# Patient Record
Sex: Female | Born: 1990
Health system: Southern US, Community
[De-identification: ages and names within clinical notes are randomized; demographics above are authoritative.]

## PROBLEM LIST (undated history)

## (undated) DIAGNOSIS — E119 Type 2 diabetes mellitus without complications: Secondary | ICD-10-CM

## (undated) DIAGNOSIS — J1282 Pneumonia due to coronavirus disease 2019: Secondary | ICD-10-CM

## (undated) DIAGNOSIS — U071 COVID-19: Secondary | ICD-10-CM

---

## 2013-12-28 ENCOUNTER — Emergency Department (HOSPITAL_COMMUNITY)
Admission: EM | Admit: 2013-12-28 | Discharge: 2013-12-29 | Disposition: A | Discharge status: Home / Self Care | Payer: No Typology Code available for payment source | Attending: Emergency Medicine | Admitting: Emergency Medicine

## 2013-12-28 ENCOUNTER — Encounter (HOSPITAL_COMMUNITY): Payer: Self-pay | Admitting: Emergency Medicine

## 2013-12-28 DIAGNOSIS — R0602 Shortness of breath: Secondary | ICD-10-CM | POA: Insufficient documentation

## 2013-12-28 DIAGNOSIS — IMO0002 Reserved for concepts with insufficient information to code with codable children: Secondary | ICD-10-CM | POA: Insufficient documentation

## 2013-12-28 DIAGNOSIS — S99929A Unspecified injury of unspecified foot, initial encounter: Secondary | ICD-10-CM

## 2013-12-28 DIAGNOSIS — M79661 Pain in right lower leg: Secondary | ICD-10-CM

## 2013-12-28 DIAGNOSIS — S8990XA Unspecified injury of unspecified lower leg, initial encounter: Secondary | ICD-10-CM | POA: Insufficient documentation

## 2013-12-28 DIAGNOSIS — S335XXA Sprain of ligaments of lumbar spine, initial encounter: Secondary | ICD-10-CM | POA: Insufficient documentation

## 2013-12-28 DIAGNOSIS — S39012A Strain of muscle, fascia and tendon of lower back, initial encounter: Secondary | ICD-10-CM

## 2013-12-28 DIAGNOSIS — Y92410 Unspecified street and highway as the place of occurrence of the external cause: Secondary | ICD-10-CM | POA: Insufficient documentation

## 2013-12-28 DIAGNOSIS — Y9389 Activity, other specified: Secondary | ICD-10-CM | POA: Insufficient documentation

## 2013-12-28 DIAGNOSIS — S99919A Unspecified injury of unspecified ankle, initial encounter: Secondary | ICD-10-CM
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
## 2013-12-29 ENCOUNTER — Ambulatory Visit (HOSPITAL_COMMUNITY)
Admission: RE | Admit: 2013-12-29 | Discharge: 2013-12-29 | Disposition: A | Discharge status: Home / Self Care | Payer: Self-pay | Source: Ambulatory Visit | Attending: Emergency Medicine | Admitting: Emergency Medicine

## 2013-12-29 DIAGNOSIS — M7989 Other specified soft tissue disorders: Secondary | ICD-10-CM

## 2013-12-29 DIAGNOSIS — M79609 Pain in unspecified limb: Secondary | ICD-10-CM | POA: Insufficient documentation

## 2013-12-29 MED ORDER — ENOXAPARIN SODIUM 120 MG/0.8ML ~~LOC~~ SOLN
1.0000 mg/kg | Freq: Once | SUBCUTANEOUS | Status: AC
Start: 2013-12-29 — End: 2013-12-29
  Administered 2013-12-29: 115 mg via SUBCUTANEOUS
  Filled 2013-12-29: qty 0.8

## 2013-12-29 MED ORDER — HYDROCODONE-ACETAMINOPHEN 5-325 MG PO TABS: 1.0000 | ORAL_TABLET | Freq: Four times a day (QID) | ORAL | Status: DC | PRN

## 2013-12-29 MED ORDER — NAPROXEN 500 MG PO TABS: 500.0000 mg | ORAL_TABLET | Freq: Two times a day (BID) | ORAL | Status: DC

## 2013-12-29 NOTE — ED Provider Notes (Signed)
Medical screening examination/treatment/procedure(s) were performed by non-physician practitioner and as supervising physician I was immediately available for consultation/collaboration.   EKG Interpretation None        William Avrianna Smart, MD 12/29/13 2315 
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
## 2013-12-29 NOTE — Progress Notes (Signed)
Right lower extremity venous duplex completed.  Right:  No evidence of DVT, superficial thrombosis, or Baker's cyst.  Left:  Negative for DVT in the common femoral vein.  

## 2014-04-14 ENCOUNTER — Emergency Department (HOSPITAL_COMMUNITY)
Admission: EM | Admit: 2014-04-14 | Discharge: 2014-04-14 | Discharge status: Against Medical Advice | Payer: No Typology Code available for payment source | Attending: Emergency Medicine | Admitting: Emergency Medicine

## 2014-04-14 ENCOUNTER — Encounter (HOSPITAL_COMMUNITY): Payer: Self-pay | Admitting: Emergency Medicine

## 2014-04-14 DIAGNOSIS — R51 Headache: Secondary | ICD-10-CM | POA: Insufficient documentation

## 2014-04-14 LAB — URINALYSIS, ROUTINE W REFLEX MICROSCOPIC
BILIRUBIN URINE: NEGATIVE
Glucose, UA: NEGATIVE mg/dL
Ketones, ur: NEGATIVE mg/dL
Nitrite: NEGATIVE
PH: 6 (ref 5.0–8.0)
Protein, ur: NEGATIVE mg/dL
SPECIFIC GRAVITY, URINE: 1.031 — AB (ref 1.005–1.030)
UROBILINOGEN UA: 1 mg/dL (ref 0.0–1.0)

## 2014-04-14 LAB — URINE MICROSCOPIC-ADD ON

## 2014-04-14 LAB — POC URINE PREG, ED: PREG TEST UR: NEGATIVE

## 2014-04-14 NOTE — ED Notes (Signed)
Pt c/o headache x's 1 1/2 weeks, nausea without vomiting.  Pt also st's she also has had dizziness with headache.  Pt also c/o sinus drainage

## 2014-04-14 NOTE — ED Notes (Signed)
Called x 1 no answer

## 2014-07-29 ENCOUNTER — Emergency Department (HOSPITAL_COMMUNITY)
Admission: EM | Admit: 2014-07-29 | Discharge: 2014-07-30 | Disposition: A | Discharge status: Home / Self Care | Payer: Self-pay | Attending: Emergency Medicine | Admitting: Emergency Medicine

## 2014-07-29 ENCOUNTER — Encounter (HOSPITAL_COMMUNITY): Payer: Self-pay | Admitting: *Deleted

## 2014-07-29 DIAGNOSIS — R1012 Left upper quadrant pain: Secondary | ICD-10-CM

## 2014-07-29 DIAGNOSIS — Z3202 Encounter for pregnancy test, result negative: Secondary | ICD-10-CM | POA: Insufficient documentation

## 2014-07-29 DIAGNOSIS — N939 Abnormal uterine and vaginal bleeding, unspecified: Secondary | ICD-10-CM

## 2014-07-29 DIAGNOSIS — Z791 Long term (current) use of non-steroidal anti-inflammatories (NSAID): Secondary | ICD-10-CM | POA: Insufficient documentation

## 2014-07-29 DIAGNOSIS — N938 Other specified abnormal uterine and vaginal bleeding: Secondary | ICD-10-CM | POA: Insufficient documentation

## 2014-07-29 LAB — URINE MICROSCOPIC-ADD ON

## 2014-07-29 LAB — CBC WITH DIFFERENTIAL/PLATELET
BASOS ABS: 0 10*3/uL (ref 0.0–0.1)
Basophils Relative: 0 % (ref 0–1)
Eosinophils Absolute: 0.1 10*3/uL (ref 0.0–0.7)
Eosinophils Relative: 2 % (ref 0–5)
HCT: 39.1 % (ref 36.0–46.0)
Hemoglobin: 12.9 g/dL (ref 12.0–15.0)
LYMPHS PCT: 45 % (ref 12–46)
Lymphs Abs: 3.4 10*3/uL (ref 0.7–4.0)
MCH: 27.3 pg (ref 26.0–34.0)
MCHC: 33 g/dL (ref 30.0–36.0)
MCV: 82.8 fL (ref 78.0–100.0)
Monocytes Absolute: 0.5 10*3/uL (ref 0.1–1.0)
Monocytes Relative: 7 % (ref 3–12)
NEUTROS ABS: 3.5 10*3/uL (ref 1.7–7.7)
Neutrophils Relative %: 46 % (ref 43–77)
PLATELETS: 365 10*3/uL (ref 150–400)
RBC: 4.72 MIL/uL (ref 3.87–5.11)
RDW: 13.1 % (ref 11.5–15.5)
WBC: 7.6 10*3/uL (ref 4.0–10.5)

## 2014-07-29 LAB — URINALYSIS, ROUTINE W REFLEX MICROSCOPIC
BILIRUBIN URINE: NEGATIVE
Glucose, UA: NEGATIVE mg/dL
KETONES UR: NEGATIVE mg/dL
Nitrite: NEGATIVE
Protein, ur: NEGATIVE mg/dL
Specific Gravity, Urine: 1.026 (ref 1.005–1.030)
UROBILINOGEN UA: 0.2 mg/dL (ref 0.0–1.0)
pH: 5 (ref 5.0–8.0)

## 2014-07-29 LAB — PREGNANCY, URINE: Preg Test, Ur: NEGATIVE

## 2014-07-29 LAB — SAMPLE TO BLOOD BANK

## 2014-07-29 NOTE — ED Notes (Signed)
Pt states her OBGYN told her she was pregnant on Monday. Pt c/o abdominal cramping and some spotting when she wiped, and nausea. Denies vomiting Pt is a G1P0. LMP over two months ago.

## 2014-07-30 LAB — COMPREHENSIVE METABOLIC PANEL
ALBUMIN: 3.7 g/dL (ref 3.5–5.2)
ALT: 25 U/L (ref 0–35)
ANION GAP: 9 (ref 5–15)
AST: 25 U/L (ref 0–37)
Alkaline Phosphatase: 61 U/L (ref 39–117)
BILIRUBIN TOTAL: 0.2 mg/dL — AB (ref 0.3–1.2)
BUN: 8 mg/dL (ref 6–23)
CALCIUM: 9.3 mg/dL (ref 8.4–10.5)
CHLORIDE: 109 mmol/L (ref 96–112)
CO2: 21 mmol/L (ref 19–32)
CREATININE: 0.93 mg/dL (ref 0.50–1.10)
GFR calc non Af Amer: 87 mL/min — ABNORMAL LOW (ref >90)
Glucose, Bld: 102 mg/dL — ABNORMAL HIGH (ref 70–99)
Potassium: 3.7 mmol/L (ref 3.5–5.1)
Sodium: 139 mmol/L (ref 135–145)
Total Protein: 7.4 g/dL (ref 6.0–8.3)

## 2014-07-30 LAB — HCG, QUANTITATIVE, PREGNANCY: hCG, Beta Chain, Quant, S: <1 m[IU]/mL (ref <5)

## 2014-07-30 MED ORDER — ONDANSETRON 8 MG PO TBDP: 8.0000 mg | ORAL_TABLET | Freq: Three times a day (TID) | ORAL | Status: DC | PRN

## 2014-07-30 NOTE — ED Notes (Signed)
Patient states she was told she is pregnant at OB/GYN and is spotting this morning. N&V present. Was concerned about the spotting

## 2014-07-30 NOTE — ED Notes (Signed)
MD at bedside. Patient provided with results of lab work.

## 2014-07-30 NOTE — Discharge Instructions (Signed)

## 2014-07-30 NOTE — ED Provider Notes (Signed)
CSN: [id number]     Arrival date & time 07/29/14  2201 History  This chart was scribed for Lyanne Co, MD by Tanda Rockers, ED Scribe. This patient was seen in room B19C/B19C and the patient's care was started at 12:30 AM.    Chief Complaint  Patient presents with  . Possible Pregnancy  . Abdominal Pain   The history is provided by the patient. No language interpreter was used.     HPI Comments: Rachel Russo is a 24 y.o. female who presents to the Emergency Department complaining of upper abdominal pain that began earlier today. She also complains of nausea and vaginal spotting only upon wiping. Pt was seen at [facility] on Monday, 2/22, and had urine pregnancy test done which resulted positive. Pt states that she is no longer nauseas at this time. She denies vomiting, bowel changes, or any other symptoms. LNMP: November 2015. Pt denies being on any birth control at this time.   History reviewed. No pertinent past medical history. History reviewed. No pertinent past surgical history. History reviewed. No pertinent family history. History  Substance Use Topics  . Smoking status: Never Smoker   . Smokeless tobacco: Not on file  . Alcohol Use: No   OB History    Gravida Para Term Preterm AB TAB SAB Ectopic Multiple Living   1              Review of Systems  A complete 10 system review of systems was obtained and all systems are negative except as noted in the HPI and PMH.    Allergies  Review of patient's allergies indicates no known allergies.  Home Medications   Prior to Admission medications   Medication Sig Start Date End Date Taking? Authorizing Provider  Aspirin-Salicylamide-Caffeine (BC HEADACHE POWDER PO) Take 1 tablet by mouth daily as needed. For headache    Historical Provider, MD  HYDROcodone-acetaminophen (NORCO) 5-325 MG per tablet Take 1-2 tablets by mouth every 6 (six) hours as needed for moderate pain. Patient not taking: Reported on 04/14/2014 12/29/13    Arthor Captain, PA-C  naproxen (NAPROSYN) 500 MG tablet Take 1 tablet (500 mg total) by mouth 2 (two) times daily. Patient not taking: Reported on 04/14/2014 12/29/13   Arthor Captain, PA-C   Triage Vitals: BP 157/100 mmHg  Pulse 112  Temp(Src) 98.4 F (36.9 C) (Oral)  Resp 18  Ht  (1.753 m)  Wt 277 lb 8 oz (125.873 kg)  BMI 40.96 kg/m2  SpO2 98%  LMP 12/30/2013  Physical Exam  Constitutional: She is oriented to person, place, and time. She appears well-developed and well-nourished. No distress.  HENT:  Head: Normocephalic and atraumatic.  Eyes: EOM are normal.  Neck: Normal range of motion.  Cardiovascular: Normal rate, regular rhythm and normal heart sounds.   Pulmonary/Chest: Effort normal and breath sounds normal.  Abdominal: Soft. She exhibits no distension. There is no tenderness.  Musculoskeletal: Normal range of motion.  Neurological: She is alert and oriented to person, place, and time.  Skin: Skin is warm and dry.  Psychiatric: She has a normal mood and affect. Judgment normal.  Nursing note and vitals reviewed.   ED Course  Procedures (including critical care time) DIAGNOSTIC STUDIES: Oxygen Saturation is 98% on RA, normal by my interpretation.    COORDINATION OF CARE:   12:33 AM-Discussed treatment plan which includes follow up with OBGYN with pt at bedside and pt agreed to plan.   Labs Review Labs Reviewed  COMPREHENSIVE  METABOLIC PANEL - Abnormal; Notable for the following:    Glucose, Bld 102 (*)    Total Bilirubin 0.2 (*)    GFR calc non Af Amer 87 (*)    All other components within normal limits  URINALYSIS, ROUTINE W REFLEX MICROSCOPIC - Abnormal; Notable for the following:    APPearance CLOUDY (*)    Hgb urine dipstick TRACE (*)    Leukocytes, UA SMALL (*)    All other components within normal limits  URINE MICROSCOPIC-ADD ON - Abnormal; Notable for the following:    Squamous Epithelial / LPF FEW (*)    All other components within normal  limits  CBC WITH DIFFERENTIAL/PLATELET  PREGNANCY, URINE  HCG, QUANTITATIVE, PREGNANCY  SAMPLE TO BLOOD BANK    Imaging Review No results found.   EKG Interpretation None      MDM   Final diagnoses:  None    Patient is overall well-appearing.  Abdominal exam is benign.  Vital signs are normal. Pregnancy test is negative.  Primary care follow-up   I personally performed the services described in this documentation, which was scribed in my presence. The recorded information has been reviewed and is accurate.       Lyanne CoKevin M Branndon Tuite, MD 07/30/14 [phone]

## 2014-10-22 ENCOUNTER — Encounter (HOSPITAL_BASED_OUTPATIENT_CLINIC_OR_DEPARTMENT_OTHER): Payer: Self-pay | Admitting: *Deleted

## 2014-10-22 ENCOUNTER — Emergency Department (HOSPITAL_BASED_OUTPATIENT_CLINIC_OR_DEPARTMENT_OTHER)
Admission: EM | Admit: 2014-10-22 | Discharge: 2014-10-22 | Disposition: A | Discharge status: Home / Self Care | Payer: Self-pay | Attending: Emergency Medicine | Admitting: Emergency Medicine

## 2014-10-22 ENCOUNTER — Emergency Department (HOSPITAL_BASED_OUTPATIENT_CLINIC_OR_DEPARTMENT_OTHER): Payer: Self-pay

## 2014-10-22 DIAGNOSIS — Z79899 Other long term (current) drug therapy: Secondary | ICD-10-CM | POA: Insufficient documentation

## 2014-10-22 DIAGNOSIS — R42 Dizziness and giddiness: Secondary | ICD-10-CM | POA: Insufficient documentation

## 2014-10-22 DIAGNOSIS — Z3202 Encounter for pregnancy test, result negative: Secondary | ICD-10-CM | POA: Insufficient documentation

## 2014-10-22 DIAGNOSIS — N390 Urinary tract infection, site not specified: Secondary | ICD-10-CM | POA: Insufficient documentation

## 2014-10-22 DIAGNOSIS — Z791 Long term (current) use of non-steroidal anti-inflammatories (NSAID): Secondary | ICD-10-CM | POA: Insufficient documentation

## 2014-10-22 LAB — CBC WITH DIFFERENTIAL/PLATELET
BASOS ABS: 0 10*3/uL (ref 0.0–0.1)
Basophils Relative: 0 % (ref 0–1)
EOS PCT: 1 % (ref 0–5)
Eosinophils Absolute: 0.1 10*3/uL (ref 0.0–0.7)
HCT: 40.8 % (ref 36.0–46.0)
Hemoglobin: 13.6 g/dL (ref 12.0–15.0)
LYMPHS PCT: 41 % (ref 12–46)
Lymphs Abs: 2.1 10*3/uL (ref 0.7–4.0)
MCH: 27.4 pg (ref 26.0–34.0)
MCHC: 33.3 g/dL (ref 30.0–36.0)
MCV: 82.3 fL (ref 78.0–100.0)
Monocytes Absolute: 0.5 10*3/uL (ref 0.1–1.0)
Monocytes Relative: 9 % (ref 3–12)
NEUTROS ABS: 2.5 10*3/uL (ref 1.7–7.7)
NEUTROS PCT: 49 % (ref 43–77)
PLATELETS: 349 10*3/uL (ref 150–400)
RBC: 4.96 MIL/uL (ref 3.87–5.11)
RDW: 13 % (ref 11.5–15.5)
WBC: 5.2 10*3/uL (ref 4.0–10.5)

## 2014-10-22 LAB — URINALYSIS, ROUTINE W REFLEX MICROSCOPIC
BILIRUBIN URINE: NEGATIVE
GLUCOSE, UA: NEGATIVE mg/dL
Hgb urine dipstick: NEGATIVE
Ketones, ur: NEGATIVE mg/dL
Nitrite: NEGATIVE
PROTEIN: NEGATIVE mg/dL
Specific Gravity, Urine: 1.023 (ref 1.005–1.030)
UROBILINOGEN UA: 0.2 mg/dL (ref 0.0–1.0)
pH: 6 (ref 5.0–8.0)

## 2014-10-22 LAB — BASIC METABOLIC PANEL
Anion gap: 8 (ref 5–15)
BUN: 10 mg/dL (ref 6–20)
CALCIUM: 9.5 mg/dL (ref 8.9–10.3)
CO2: 26 mmol/L (ref 22–32)
CREATININE: 0.97 mg/dL (ref 0.44–1.00)
Chloride: 105 mmol/L (ref 101–111)
Glucose, Bld: 95 mg/dL (ref 65–99)
Potassium: 3.7 mmol/L (ref 3.5–5.1)
Sodium: 139 mmol/L (ref 135–145)

## 2014-10-22 LAB — URINE MICROSCOPIC-ADD ON

## 2014-10-22 LAB — PREGNANCY, URINE: PREG TEST UR: NEGATIVE

## 2014-10-22 MED ORDER — SODIUM CHLORIDE 0.9 % IV BOLUS (SEPSIS): 1000.0000 mL | Freq: Once | INTRAVENOUS | Status: DC

## 2014-10-22 MED ORDER — CEPHALEXIN 500 MG PO CAPS: 500.0000 mg | ORAL_CAPSULE | Freq: Two times a day (BID) | ORAL | Status: DC

## 2014-10-22 NOTE — ED Notes (Signed)
Pt declined IVF; states she has to leave.

## 2014-10-22 NOTE — ED Notes (Signed)
For over a week she has been lightheaded and dizzy.

## 2014-10-22 NOTE — Discharge Instructions (Signed)
Follow up with your doctor for continued or worsening symptoms Dizziness  Dizziness means you feel unsteady or lightheaded. You might feel like you are going to pass out (faint). HOME CARE   Drink enough fluids to keep your pee (urine) clear or pale yellow.  Take your medicines exactly as told by your doctor. If you take blood pressure medicine, always stand up slowly from the lying or sitting position. Hold on to something to steady yourself.  If you need to stand in one place for a long time, move your legs often. Tighten and relax your leg muscles.  Have someone stay with you until you feel okay.  Do not drive or use heavy machinery if you feel dizzy.  Do not drink alcohol. GET HELP RIGHT AWAY IF:   You feel dizzy or lightheaded and it gets worse.  You feel sick to your stomach (nauseous), or you throw up (vomit).  You have trouble talking or walking.  You feel weak or have trouble using your arms, hands, or legs.  You cannot think clearly or have trouble forming sentences.  You have chest pain, belly (abdominal) pain, sweating, or you are short of breath.  Your vision changes.  You are bleeding.  You have problems from your medicine that seem to be getting worse. MAKE SURE YOU:   Understand these instructions.  Will watch your condition.  Will get help right away if you are not doing well or get worse. Document Released: 05/11/2011 Document Revised: 08/14/2011 Document Reviewed: 05/11/2011 [facility] Patient Information 2015 [place], [place]. This information is not intended to replace advice given to you by your health care provider. Make sure you discuss any questions you have with your health care provider.  Urinary Tract Infection A urinary tract infection (UTI) can occur any place along the urinary tract. The tract includes the kidneys, ureters, bladder, and urethra. A type of germ called bacteria often causes a UTI. UTIs are often helped with antibiotic medicine.    HOME CARE   If given, take antibiotics as told by your doctor. Finish them even if you start to feel better.  Drink enough fluids to keep your pee (urine) clear or pale yellow.  Avoid tea, drinks with caffeine, and bubbly (carbonated) drinks.  Pee often. Avoid holding your pee in for a long time.  Pee before and after having sex (intercourse).  Wipe from front to back after you poop (bowel movement) if you are a woman. Use each tissue only once. GET HELP RIGHT AWAY IF:   You have back pain.  You have lower belly (abdominal) pain.  You have chills.  You feel sick to your stomach (nauseous).  You throw up (vomit).  Your burning or discomfort with peeing does not go away.  You have a fever.  Your symptoms are not better in 3 days. MAKE SURE YOU:   Understand these instructions.  Will watch your condition.  Will get help right away if you are not doing well or get worse. Document Released: 11/08/2007 Document Revised: 02/14/2012 Document Reviewed: 12/21/2011 [facility] Patient Information 2015 [place], [place]. This information is not intended to replace advice given to you by your health care provider. Make sure you discuss any questions you have with your health care provider.

## 2014-10-22 NOTE — ED Provider Notes (Signed)
CSN: [id number]     Arrival date & time 10/22/14  1139 History   First MD Initiated Contact with Patient 10/22/14 1157     Chief Complaint  Patient presents with  . Dizziness     (Consider location/radiation/quality/duration/timing/severity/associated sxs/prior Treatment) HPI Comments: Pt states that she has a family history of aneurysm and she wants to make sure that she doesn't have one.  Patient is a 24 y.o. female presenting with dizziness. The history is provided by the patient. No language interpreter was used.  Dizziness Quality:  Lightheadedness Severity:  Moderate Onset quality:  Gradual Timing:  Constant Progression:  Unchanged Chronicity:  New Context: head movement   Relieved by:  Nothing Worsened by:  Movement Ineffective treatments:  None tried Associated symptoms: no chest pain, no diarrhea, no headaches, no nausea, no palpitations, no shortness of breath, no vision changes, no vomiting and no weakness     History reviewed. No pertinent past medical history. History reviewed. No pertinent past surgical history. No family history on file. History  Substance Use Topics  . Smoking status: Never Smoker   . Smokeless tobacco: Not on file  . Alcohol Use: No   OB History    Gravida Para Term Preterm AB TAB SAB Ectopic Multiple Living   1              Review of Systems  Respiratory: Negative for shortness of breath.   Cardiovascular: Negative for chest pain and palpitations.  Gastrointestinal: Negative for nausea, vomiting and diarrhea.  Neurological: Positive for dizziness. Negative for weakness and headaches.  All other systems reviewed and are negative.     Allergies  Review of patient's allergies indicates no known allergies.  Home Medications   Prior to Admission medications   Medication Sig Start Date End Date Taking? Authorizing Provider  Aspirin-Salicylamide-Caffeine (BC HEADACHE POWDER PO) Take 1 tablet by mouth daily as needed. For headache     Historical Provider, MD  HYDROcodone-acetaminophen (NORCO) 5-325 MG per tablet Take 1-2 tablets by mouth every 6 (six) hours as needed for moderate pain. Patient not taking: Reported on 04/14/2014 12/29/13   Arthor CaptainAbigail Harris, PA-C  naproxen (NAPROSYN) 500 MG tablet Take 1 tablet (500 mg total) by mouth 2 (two) times daily. Patient not taking: Reported on 04/14/2014 12/29/13   Arthor CaptainAbigail Harris, PA-C  ondansetron (ZOFRAN ODT) 8 MG disintegrating tablet Take 1 tablet (8 mg total) by mouth every 8 (eight) hours as needed for nausea or vomiting. 07/30/14   Azalia BilisKevin Campos, MD   BP 147/78 mmHg  Pulse 82  Temp(Src) 98 F (36.7 C) (Oral)  Resp 16  Ht 5\' 10"  (1.778 m)  Wt 280 lb (127.007 kg)  BMI 40.18 kg/m2  SpO2 100%  LMP 07/24/2014  Breastfeeding? Unknown Physical Exam  Constitutional: She is oriented to person, place, and time. She appears well-developed and well-nourished.  HENT:  Head: Normocephalic and atraumatic.  Right Ear: External ear normal.  Left Ear: External ear normal.  Mouth/Throat: Oropharynx is clear and moist.  Eyes: Conjunctivae and EOM are normal. Pupils are equal, round, and reactive to light.  Neck: Normal range of motion. Neck supple.  Cardiovascular: Normal rate and regular rhythm.   Pulmonary/Chest: Effort normal and breath sounds normal.  Abdominal: Soft. Bowel sounds are normal. There is no tenderness.  Musculoskeletal: Normal range of motion.  Neurological: She is alert and oriented to person, place, and time. She exhibits normal muscle tone. Coordination normal.  Normal finger to nose and no pronator  drift. Pt is ambulatory with assistance  Skin: Skin is warm and dry.  Psychiatric: She has a normal mood and affect.  Nursing note and vitals reviewed.   ED Course  Procedures (including critical care time) Labs Review Labs Reviewed  URINALYSIS, ROUTINE W REFLEX MICROSCOPIC - Abnormal; Notable for the following:    APPearance CLOUDY (*)    Leukocytes, UA LARGE (*)     All other components within normal limits  URINE MICROSCOPIC-ADD ON - Abnormal; Notable for the following:    Squamous Epithelial / LPF FEW (*)    All other components within normal limits  PREGNANCY, URINE  BASIC METABOLIC PANEL  CBC WITH DIFFERENTIAL/PLATELET    Imaging Review Ct Head Wo Contrast  10/22/2014   CLINICAL DATA:  Dizziness for 1 week.  EXAM: CT HEAD WITHOUT CONTRAST  TECHNIQUE: Contiguous axial images were obtained from the base of the skull through the vertex without intravenous contrast.  COMPARISON:  None.  FINDINGS: There is no evidence of mass effect, midline shift or extra-axial fluid collections. There is no evidence of a space-occupying lesion or intracranial hemorrhage. There is no evidence of a cortical-based area of acute infarction.  The ventricles and sulci are appropriate for the patient's age. The basal cisterns are patent.  Visualized portions of the orbits are unremarkable. The visualized portions of the paranasal sinuses and mastoid air cells are unremarkable.  The osseous structures are unremarkable.  IMPRESSION: Normal CT of the brain without intravenous contrast.   Electronically Signed   By: Elige KoHetal  Patel   On: 10/22/2014 12:54     EKG Interpretation None      MDM   Final diagnoses:  Dizziness  UTI (lower urinary tract infection)    Pt is refusing fluids.ambulatory without problems. Pt is ready to go. Will treat for uti. Discussed return precautions    Teressa LowerVrinda Dionna Wiedemann, NP 10/22/14 1328  Elwin MochaBlair Walden, MD 10/22/14 [phone]

## 2015-01-28 ENCOUNTER — Encounter (HOSPITAL_BASED_OUTPATIENT_CLINIC_OR_DEPARTMENT_OTHER): Payer: Self-pay | Admitting: *Deleted

## 2015-01-28 ENCOUNTER — Emergency Department (HOSPITAL_BASED_OUTPATIENT_CLINIC_OR_DEPARTMENT_OTHER): Payer: Self-pay

## 2015-01-28 ENCOUNTER — Emergency Department (HOSPITAL_BASED_OUTPATIENT_CLINIC_OR_DEPARTMENT_OTHER)
Admission: EM | Admit: 2015-01-28 | Discharge: 2015-01-28 | Disposition: A | Discharge status: Home / Self Care | Payer: Self-pay | Attending: Emergency Medicine | Admitting: Emergency Medicine

## 2015-01-28 DIAGNOSIS — J069 Acute upper respiratory infection, unspecified: Secondary | ICD-10-CM | POA: Insufficient documentation

## 2015-01-28 DIAGNOSIS — R1031 Right lower quadrant pain: Secondary | ICD-10-CM

## 2015-01-28 DIAGNOSIS — Z3202 Encounter for pregnancy test, result negative: Secondary | ICD-10-CM | POA: Insufficient documentation

## 2015-01-28 LAB — COMPREHENSIVE METABOLIC PANEL
ALBUMIN: 3.9 g/dL (ref 3.5–5.0)
ALT: 21 U/L (ref 14–54)
AST: 19 U/L (ref 15–41)
Alkaline Phosphatase: 51 U/L (ref 38–126)
Anion gap: 11 (ref 5–15)
BUN: 7 mg/dL (ref 6–20)
CHLORIDE: 103 mmol/L (ref 101–111)
CO2: 25 mmol/L (ref 22–32)
Calcium: 9.4 mg/dL (ref 8.9–10.3)
Creatinine, Ser: 1.07 mg/dL — ABNORMAL HIGH (ref 0.44–1.00)
GFR calc Af Amer: >60 mL/min (ref >60)
GLUCOSE: 118 mg/dL — AB (ref 65–99)
POTASSIUM: 3.6 mmol/L (ref 3.5–5.1)
Sodium: 139 mmol/L (ref 135–145)
Total Bilirubin: 0.6 mg/dL (ref 0.3–1.2)
Total Protein: 7.4 g/dL (ref 6.5–8.1)

## 2015-01-28 LAB — URINE MICROSCOPIC-ADD ON

## 2015-01-28 LAB — CBC WITH DIFFERENTIAL/PLATELET
Basophils Absolute: 0 10*3/uL (ref 0.0–0.1)
Basophils Relative: 0 % (ref 0–1)
Eosinophils Absolute: 0 10*3/uL (ref 0.0–0.7)
Eosinophils Relative: 0 % (ref 0–5)
HEMATOCRIT: 37.8 % (ref 36.0–46.0)
Hemoglobin: 12.7 g/dL (ref 12.0–15.0)
Lymphocytes Relative: 14 % (ref 12–46)
Lymphs Abs: 1.2 10*3/uL (ref 0.7–4.0)
MCH: 27.6 pg (ref 26.0–34.0)
MCHC: 33.6 g/dL (ref 30.0–36.0)
MCV: 82.2 fL (ref 78.0–100.0)
Monocytes Absolute: 0.7 10*3/uL (ref 0.1–1.0)
Monocytes Relative: 8 % (ref 3–12)
NEUTROS ABS: 6.9 10*3/uL (ref 1.7–7.7)
Neutrophils Relative %: 78 % — ABNORMAL HIGH (ref 43–77)
PLATELETS: 346 10*3/uL (ref 150–400)
RBC: 4.6 MIL/uL (ref 3.87–5.11)
RDW: 13.2 % (ref 11.5–15.5)
WBC: 8.8 10*3/uL (ref 4.0–10.5)

## 2015-01-28 LAB — URINALYSIS, ROUTINE W REFLEX MICROSCOPIC
Bilirubin Urine: NEGATIVE
Glucose, UA: NEGATIVE mg/dL
Ketones, ur: NEGATIVE mg/dL
LEUKOCYTES UA: NEGATIVE
Nitrite: NEGATIVE
PROTEIN: NEGATIVE mg/dL
Specific Gravity, Urine: 1.016 (ref 1.005–1.030)
UROBILINOGEN UA: 0.2 mg/dL (ref 0.0–1.0)
pH: 8.5 — ABNORMAL HIGH (ref 5.0–8.0)

## 2015-01-28 LAB — PREGNANCY, URINE: PREG TEST UR: NEGATIVE

## 2015-01-28 MED ORDER — ONDANSETRON HCL 4 MG PO TABS: 4.0000 mg | ORAL_TABLET | Freq: Four times a day (QID) | ORAL | Status: DC

## 2015-01-28 MED ORDER — ONDANSETRON HCL 4 MG/2ML IJ SOLN
4.0000 mg | Freq: Once | INTRAMUSCULAR | Status: AC
  Administered 2015-01-28: 4 mg via INTRAVENOUS
  Filled 2015-01-28: qty 2

## 2015-01-28 MED ORDER — SODIUM CHLORIDE 0.9 % IV BOLUS (SEPSIS)
1000.0000 mL | Freq: Once | INTRAVENOUS | Status: AC
  Administered 2015-01-28: 1000 mL via INTRAVENOUS

## 2015-01-28 NOTE — ED Provider Notes (Signed)
CSN: [id number]     Arrival date & time 01/28/15  1515 History   First MD Initiated Contact with Patient 01/28/15 1522     Chief Complaint  Patient presents with  . Nausea     The history is provided by the patient. No language interpreter was used.   Rachel Russo sense for evaluation of nausea. She states that since last night she had nasal congestion, sneezing, sore throat, nausea, diffuse abdominal pain. Symptoms are moderate and constant in nature. She had dry heaves since this morning. She has no reported fevers at home but she has not taken her temperature. She has a mild cough. She denies any vaginal discharge, constipation, diarrhea. She has no known sick contacts. She also reports having her menses since July 2. She states that the bleeding has been constant and like a regular period to slightly heavier than a period. Symptoms are moderate, constant, worsening.  History reviewed. No pertinent past medical history. History reviewed. No pertinent past surgical history. No family history on file. Social History  Substance Use Topics  . Smoking status: Never Smoker   . Smokeless tobacco: None  . Alcohol Use: No   OB History    Gravida Para Term Preterm AB TAB SAB Ectopic Multiple Living   1              Review of Systems  All other systems reviewed and are negative.     Allergies  Review of patient's allergies indicates no known allergies.  Home Medications   Prior to Admission medications   Medication Sig Start Date End Date Taking? Authorizing Provider  Aspirin-Salicylamide-Caffeine (BC HEADACHE POWDER PO) Take 1 tablet by mouth daily as needed. For headache    Historical Provider, MD  cephALEXin (KEFLEX) 500 MG capsule Take 1 capsule (500 mg total) by mouth 2 (two) times daily. 10/22/14   Teressa Lower, NP  HYDROcodone-acetaminophen (NORCO) 5-325 MG per tablet Take 1-2 tablets by mouth every 6 (six) hours as needed for moderate pain. Patient not taking: Reported on  04/14/2014 12/29/13   Arthor Captain, PA-C  naproxen (NAPROSYN) 500 MG tablet Take 1 tablet (500 mg total) by mouth 2 (two) times daily. Patient not taking: Reported on 04/14/2014 12/29/13   Arthor Captain, PA-C  ondansetron (ZOFRAN ODT) 8 MG disintegrating tablet Take 1 tablet (8 mg total) by mouth every 8 (eight) hours as needed for nausea or vomiting. 07/30/14   Azalia Bilis, MD   BP 134/70 mmHg  Pulse 110  Temp(Src) 100.4 F (38 C) (Oral)  Resp 18  Ht  (1.778 m)  Wt 280 lb (127.007 kg)  BMI 40.18 kg/m2  SpO2 99%  LMP 12/05/2014 Physical Exam  Constitutional: She is oriented to person, place, and time. She appears well-developed and well-nourished.  HENT:  Head: Normocephalic and atraumatic.  Mouth/Throat: Oropharynx is clear and moist.  TMs without erythema or effusions bilaterally  Neck: Neck supple.  Cardiovascular: Normal rate and regular rhythm.   No murmur heard. Pulmonary/Chest: Effort normal and breath sounds normal. No respiratory distress.  Abdominal: Soft. There is no rebound and no guarding.  Moderate right-sided abdominal tenderness greatest over the right lower quadrant without any guarding or rebound  Musculoskeletal: She exhibits no edema or tenderness.  Neurological: She is alert and oriented to person, place, and time.  Skin: Skin is warm and dry.  Psychiatric: She has a normal mood and affect. Her behavior is normal.  Nursing note and vitals reviewed.   ED  Course  Procedures (including critical care time) Labs Review Labs Reviewed  URINALYSIS, ROUTINE W REFLEX MICROSCOPIC (NOT AT [facility]) - Abnormal; Notable for the following:    APPearance CLOUDY (*)    pH 8.5 (*)    Hgb urine dipstick LARGE (*)    All other components within normal limits  COMPREHENSIVE METABOLIC PANEL - Abnormal; Notable for the following:    Glucose, Bld 118 (*)    Creatinine, Ser 1.07 (*)    All other components within normal limits  CBC WITH DIFFERENTIAL/PLATELET - Abnormal;  Notable for the following:    Neutrophils Relative % 78 (*)    All other components within normal limits  URINE MICROSCOPIC-ADD ON - Abnormal; Notable for the following:    Squamous Epithelial / LPF FEW (*)    Bacteria, UA FEW (*)    All other components within normal limits  PREGNANCY, URINE    Imaging Review No results found.   EKG Interpretation None      MDM   Final diagnoses:  URI, acute  Right lower quadrant abdominal pain    Patient here for violation of nausea, lower abdominal pain, congestion. In terms of congestion, this is consistent with URI. Patient does have right lower quadrant tenderness on examination, she is feeling improved after Zofran but does have continued tenderness. Recommend imaging to further evaluate and patient declines at this time. Plan to DC home with close return precautions for repeat abdominal examination Hours.    Tilden Fossa, MD 01/28/15 [phone]

## 2015-01-28 NOTE — ED Notes (Signed)
MD at bedside. 

## 2015-01-28 NOTE — Discharge Instructions (Signed)
Abdominal Pain, Women °Abdominal (stomach, pelvic, or belly) pain can be caused by many things. It is important to tell your doctor: °· The location of the pain. °· Does it come and go or is it present all the time? °· Are there things that start the pain (eating certain foods, exercise)? °· Are there other symptoms associated with the pain (fever, nausea, vomiting, diarrhea)? °All of this is helpful to know when trying to find the cause of the pain. °CAUSES  °· Stomach: virus or bacteria infection, or ulcer. °· Intestine: appendicitis (inflamed appendix), regional ileitis (Crohn's disease), ulcerative colitis (inflamed colon), irritable bowel syndrome, diverticulitis (inflamed diverticulum of the colon), or cancer of the stomach or intestine. °· Gallbladder disease or stones in the gallbladder. °· Kidney disease, kidney stones, or infection. °· Pancreas infection or cancer. °· Fibromyalgia (pain disorder). °· Diseases of the female organs: °· Uterus: fibroid (non-cancerous) tumors or infection. °· Fallopian tubes: infection or tubal pregnancy. °· Ovary: cysts or tumors. °· Pelvic adhesions (scar tissue). °· Endometriosis (uterus lining tissue growing in the pelvis and on the pelvic organs). °· Pelvic congestion syndrome (female organs filling up with blood just before the menstrual period). °· Pain with the menstrual period. °· Pain with ovulation (producing an egg). °· Pain with an IUD (intrauterine device, birth control) in the uterus. °· Cancer of the female organs. °· Functional pain (pain not caused by a disease, may improve without treatment). °· Psychological pain. °· Depression. °DIAGNOSIS  °Your doctor will decide the seriousness of your pain by doing an examination. °· Blood tests. °· X-rays. °· Ultrasound. °· CT scan (computed tomography, special type of X-ray). °· MRI (magnetic resonance imaging). °· Cultures, for infection. °· Barium enema (dye inserted in the large intestine, to better view it with  X-rays). °· Colonoscopy (looking in intestine with a lighted tube). °· Laparoscopy (minor surgery, looking in abdomen with a lighted tube). °· Major abdominal exploratory surgery (looking in abdomen with a large incision). °TREATMENT  °The treatment will depend on the cause of the pain.  °· Many cases can be observed and treated at home. °· Over-the-counter medicines recommended by your caregiver. °· Prescription medicine. °· Antibiotics, for infection. °· Birth control pills, for painful periods or for ovulation pain. °· Hormone treatment, for endometriosis. °· Nerve blocking injections. °· Physical therapy. °· Antidepressants. °· Counseling with a psychologist or psychiatrist. °· Minor or major surgery. °HOME CARE INSTRUCTIONS  °· Do not take laxatives, unless directed by your caregiver. °· Take over-the-counter pain medicine only if ordered by your caregiver. Do not take aspirin because it can cause an upset stomach or bleeding. °· Try a clear liquid diet (broth or water) as ordered by your caregiver. Slowly move to a bland diet, as tolerated, if the pain is related to the stomach or intestine. °· Have a thermometer and take your temperature several times a day, and record it. °· Bed rest and sleep, if it helps the pain. °· Avoid sexual intercourse, if it causes pain. °· Avoid stressful situations. °· Keep your follow-up appointments and tests, as your caregiver orders. °· If the pain does not go away with medicine or surgery, you may try: °· Acupuncture. °· Relaxation exercises (yoga, meditation). °· Group therapy. °· Counseling. °SEEK MEDICAL CARE IF:  °· You notice certain foods cause stomach pain. °· Your home care treatment is not helping your pain. °· You need stronger pain medicine. °· You want your IUD removed. °· You feel faint or   lightheaded.  You develop nausea and vomiting.  You develop a rash.  You are having side effects or an allergy to your medicine. SEEK IMMEDIATE MEDICAL CARE IF:   Your  pain does not go away or gets worse.  You have a fever.  Your pain is felt only in portions of the abdomen. The right side could possibly be appendicitis. The left lower portion of the abdomen could be colitis or diverticulitis.  You are passing blood in your stools (bright red or black tarry stools, with or without vomiting).  You have blood in your urine.  You develop chills, with or without a fever.  You pass out. MAKE SURE YOU:   Understand these instructions.  Will watch your condition.  Will get help right away if you are not doing well or get worse. Document Released: 03/19/2007 Document Revised: 10/06/2013 Document Reviewed: 04/08/2009 [facility] Patient Information 2015 [place], [place]. This information is not intended to replace advice given to you by your health care provider. Make sure you discuss any questions you have with your health care provider.  Viral Infections A viral infection can be caused by different types of viruses.Most viral infections are not serious and resolve on their own. However, some infections may cause severe symptoms and may lead to further complications. SYMPTOMS Viruses can frequently cause:  Minor sore throat.  Aches and pains.  Headaches.  Runny nose.  Different types of rashes.  Watery eyes.  Tiredness.  Cough.  Loss of appetite.  Gastrointestinal infections, resulting in nausea, vomiting, and diarrhea. These symptoms do not respond to antibiotics because the infection is not caused by bacteria. However, you might catch a bacterial infection following the viral infection. This is sometimes called a "superinfection." Symptoms of such a bacterial infection may include:  Worsening sore throat with pus and difficulty swallowing.  Swollen neck glands.  Chills and a high or persistent fever.  Severe headache.  Tenderness over the sinuses.  Persistent overall ill feeling (malaise), muscle aches, and tiredness  (fatigue).  Persistent cough.  Yellow, green, or brown mucus production with coughing. HOME CARE INSTRUCTIONS   Only take over-the-counter or prescription medicines for pain, discomfort, diarrhea, or fever as directed by your caregiver.  Drink enough water and fluids to keep your urine clear or pale yellow. Sports drinks can provide valuable electrolytes, sugars, and hydration.  Get plenty of rest and maintain proper nutrition. Soups and broths with crackers or rice are fine. SEEK IMMEDIATE MEDICAL CARE IF:   You have severe headaches, shortness of breath, chest pain, neck pain, or an unusual rash.  You have uncontrolled vomiting, diarrhea, or you are unable to keep down fluids.  You or your child has an oral temperature above 102 F (38.9 C), not controlled by medicine.  Your baby is older than 3 months with a rectal temperature of 102 F (38.9 C) or higher.  Your baby is 48 months old or younger with a rectal temperature of 100.4 F (38 C) or higher. MAKE SURE YOU:   Understand these instructions.  Will watch your condition.  Will get help right away if you are not doing well or get worse. Document Released: 03/01/2005 Document Revised: 08/14/2011 Document Reviewed: 09/26/2010 [facility] Patient Information 2015 [place], [place]. This information is not intended to replace advice given to you by your health care provider. Make sure you discuss any questions you have with your health care provider.

## 2015-01-28 NOTE — ED Notes (Addendum)
Nausea since yesterday. Abdominal pain. Vaginal bleeding for over a month.

## 2015-01-29 NOTE — ED Notes (Signed)
Patient called to state that she was feeling better and did not fell that she needs to come back in per her discharge instructions from yesterday.  Encouraged to follow her discharge instructions.  Reviewed signs and symptoms that would require her immediate return, fever, increased and localized pain, vomiting and other concerns.  Pt verbalized understanding.

## 2015-08-16 IMAGING — CT CT HEAD W/O CM
1 series · 16 of 30 positions shown, 20 images · non-contrast
Comparison: None.

CLINICAL DATA: Dizziness for 1 week.

EXAM:
CT HEAD WITHOUT CONTRAST
TECHNIQUE: Contiguous axial images were obtained from the base of the skull
through the vertex without intravenous contrast.

[Series 2: head 4.8 h37s · axial · 0.45mm/px · z∈[+1395,+1531]mm · 16 of 32 slices shown, 20 images]
[im 2/32  brain]
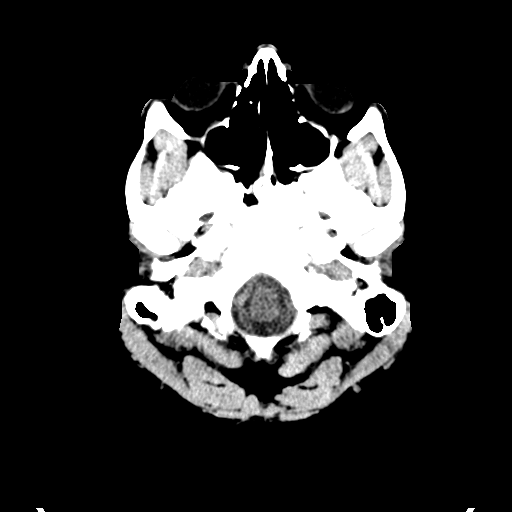
[im 2/32  bone]
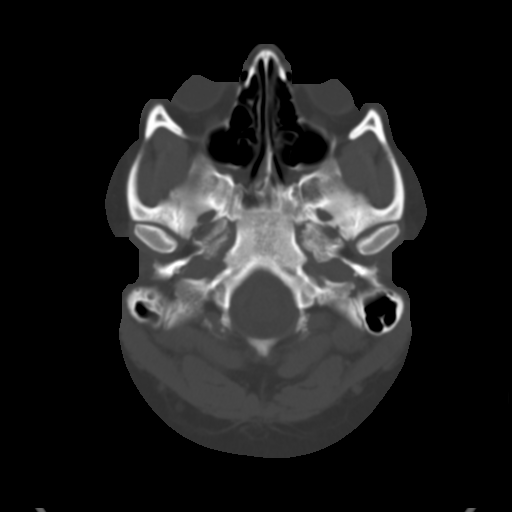
[im 4/32  brain]
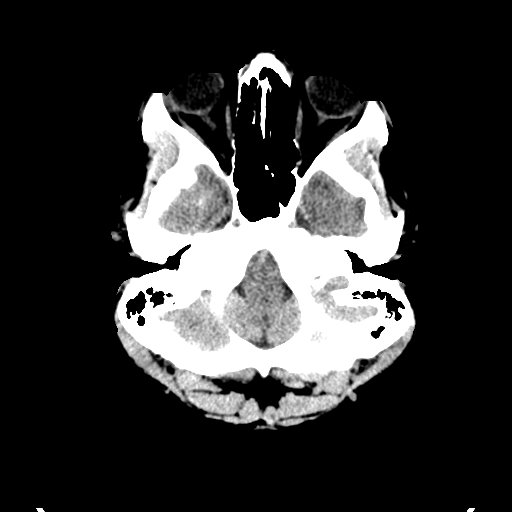
[im 6/32  brain]
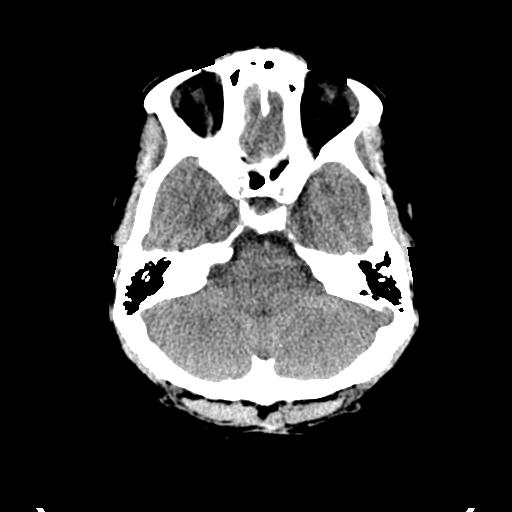
[im 8/32  brain]
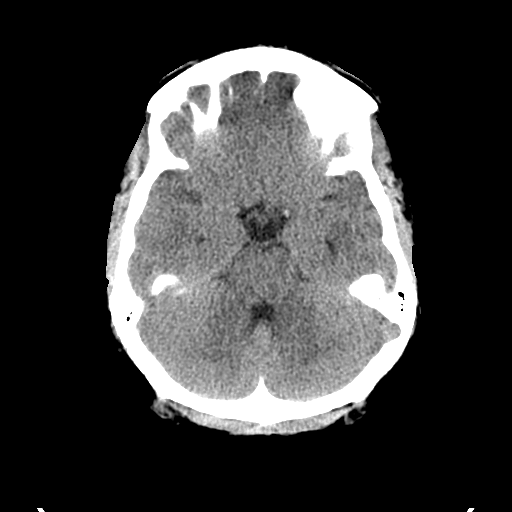
[im 9/32  brain]
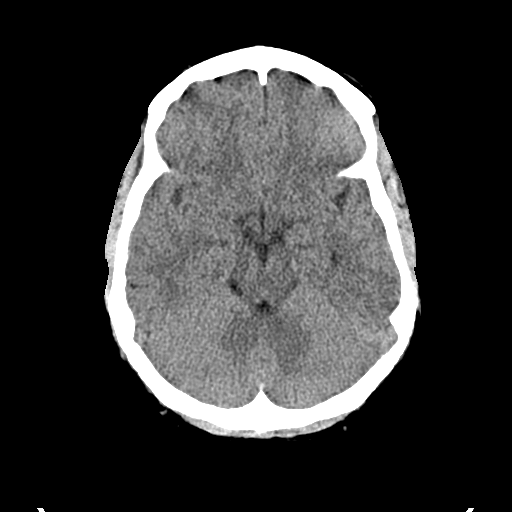
[im 9/32  bone]
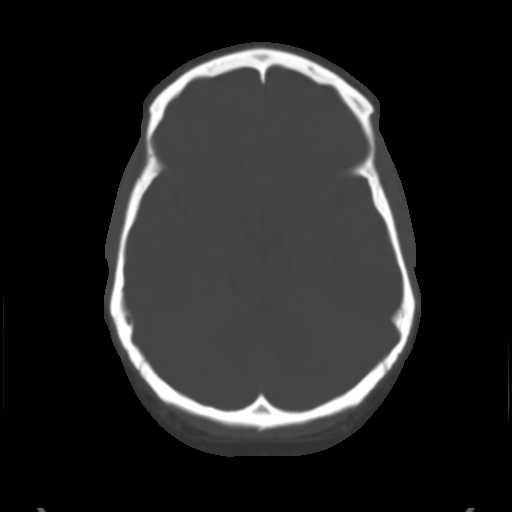
[im 11/32  brain]
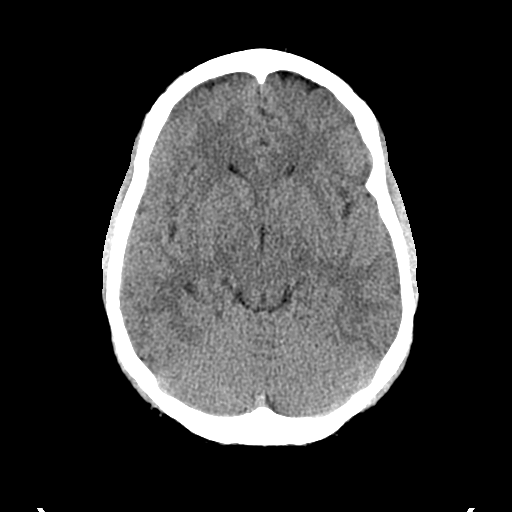
[im 13/32  brain]
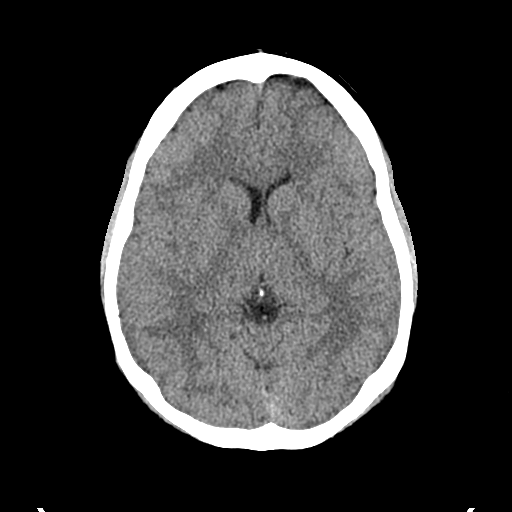
[im 15/32  brain]
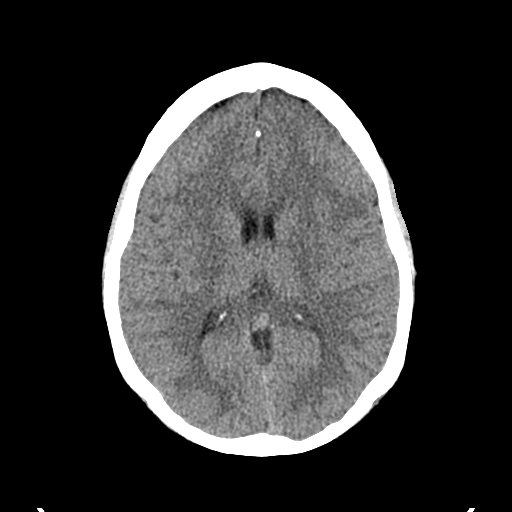
[im 17/32  brain]
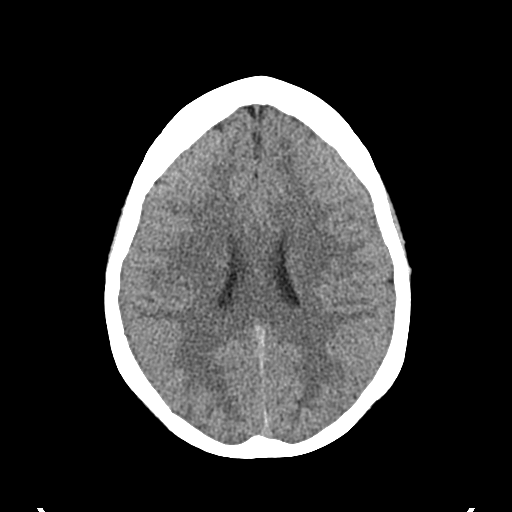
[im 17/32  bone]
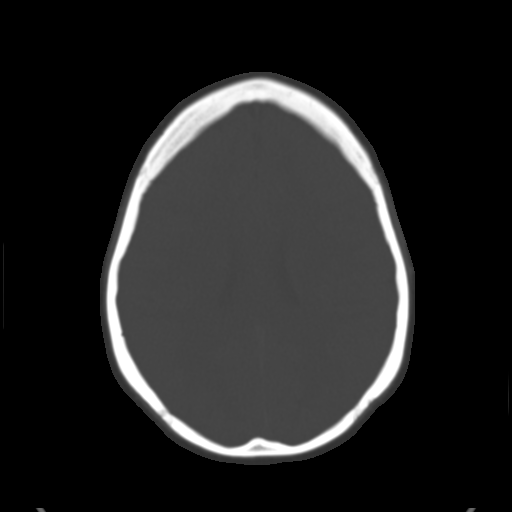
[im 19/32  brain]
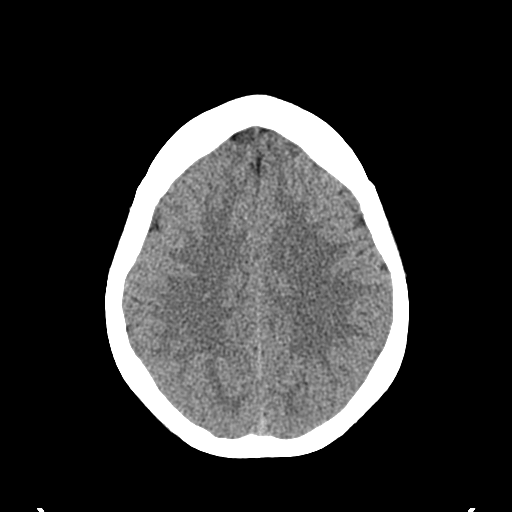
[im 21/32  brain]
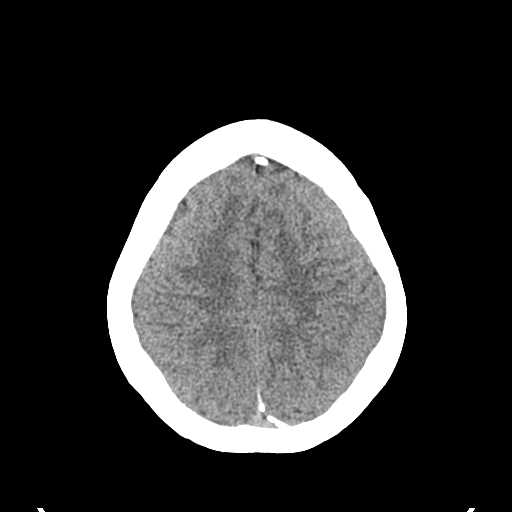
[im 23/32  brain]
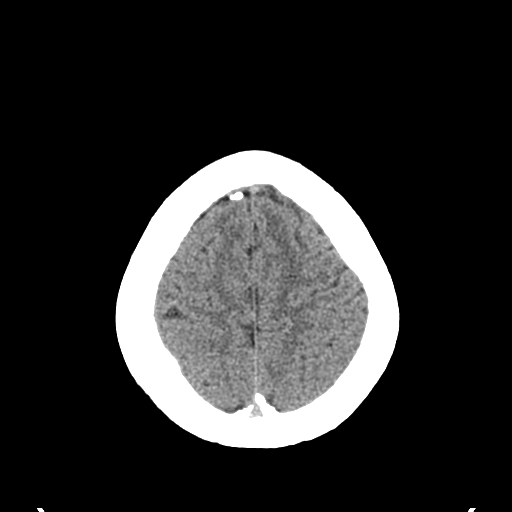
[im 24/32  brain]
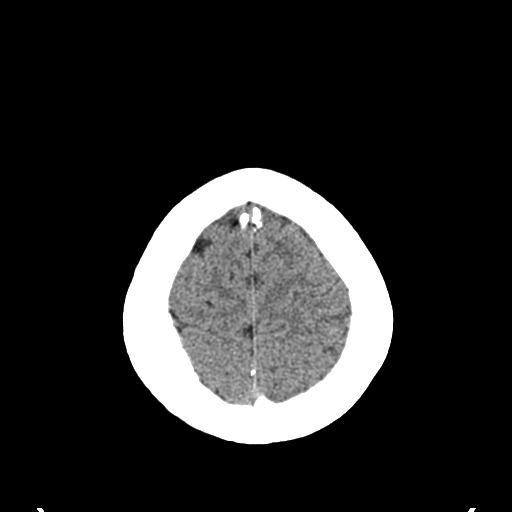
[im 24/32  bone]
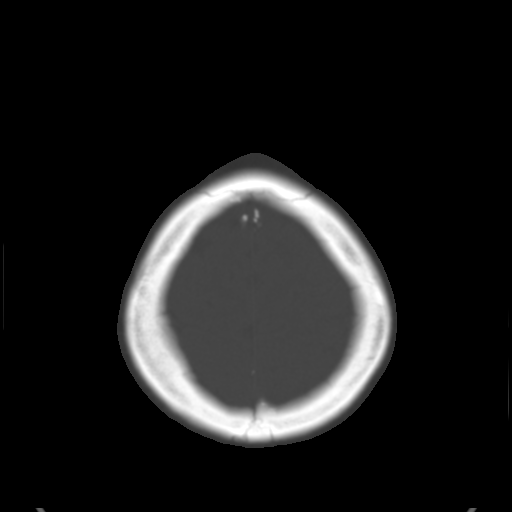
[im 26/32  brain]
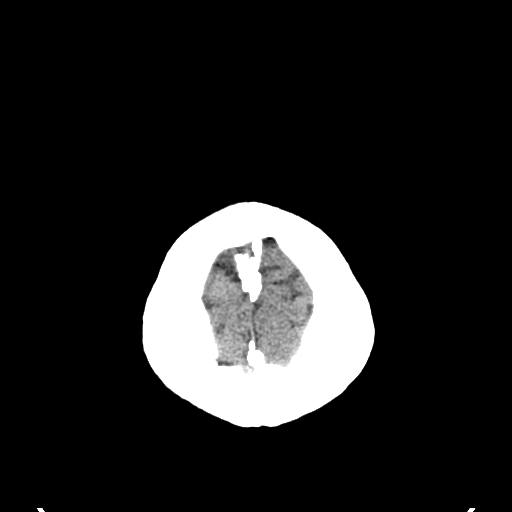
[im 28/32  brain]
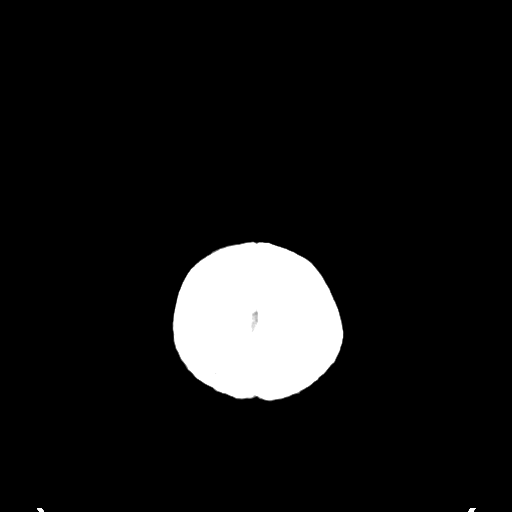
[im 30/32  brain]
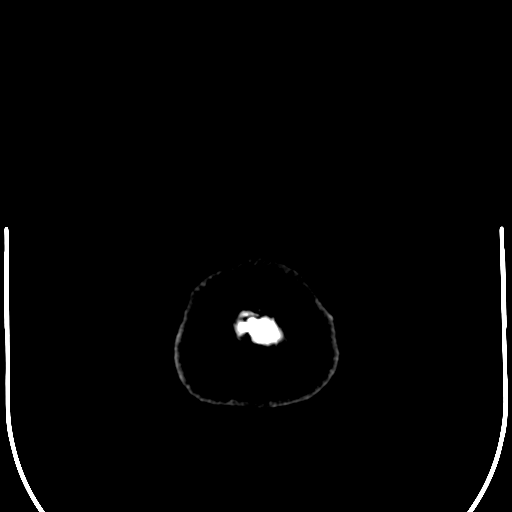

[16 of 30 positions shown; findings below may reference images not displayed]

FINDINGS: There is no evidence of mass effect, midline shift or extra-axial
fluid collections. There is no evidence of a space-occupying lesion
or intracranial hemorrhage. There is no evidence of a cortical-based
area of acute infarction.

The ventricles and sulci are appropriate for the patient's age. The
basal cisterns are patent.

Visualized portions of the orbits are unremarkable. The visualized
portions of the paranasal sinuses and mastoid air cells are
unremarkable.

The osseous structures are unremarkable.
IMPRESSION: Normal CT of the brain without intravenous contrast.

## 2015-11-01 ENCOUNTER — Emergency Department (HOSPITAL_BASED_OUTPATIENT_CLINIC_OR_DEPARTMENT_OTHER)
Admission: EM | Admit: 2015-11-01 | Discharge: 2015-11-01 | Disposition: A | Discharge status: Home / Self Care | Payer: Self-pay | Attending: Emergency Medicine | Admitting: Emergency Medicine

## 2015-11-01 ENCOUNTER — Emergency Department (HOSPITAL_BASED_OUTPATIENT_CLINIC_OR_DEPARTMENT_OTHER): Payer: Self-pay

## 2015-11-01 ENCOUNTER — Encounter (HOSPITAL_BASED_OUTPATIENT_CLINIC_OR_DEPARTMENT_OTHER): Payer: Self-pay

## 2015-11-01 DIAGNOSIS — R52 Pain, unspecified: Secondary | ICD-10-CM

## 2015-11-01 DIAGNOSIS — E282 Polycystic ovarian syndrome: Secondary | ICD-10-CM | POA: Insufficient documentation

## 2015-11-01 DIAGNOSIS — N938 Other specified abnormal uterine and vaginal bleeding: Secondary | ICD-10-CM | POA: Insufficient documentation

## 2015-11-01 DIAGNOSIS — D500 Iron deficiency anemia secondary to blood loss (chronic): Secondary | ICD-10-CM | POA: Insufficient documentation

## 2015-11-01 LAB — URINALYSIS, ROUTINE W REFLEX MICROSCOPIC
Bilirubin Urine: NEGATIVE
Glucose, UA: NEGATIVE mg/dL
KETONES UR: NEGATIVE mg/dL
NITRITE: NEGATIVE
PH: 7 (ref 5.0–8.0)
Protein, ur: NEGATIVE mg/dL
Specific Gravity, Urine: 1.006 (ref 1.005–1.030)

## 2015-11-01 LAB — CBC WITH DIFFERENTIAL/PLATELET
BASOS PCT: 0 %
Basophils Absolute: 0 10*3/uL (ref 0.0–0.1)
EOS PCT: 0 %
Eosinophils Absolute: 0 10*3/uL (ref 0.0–0.7)
HEMATOCRIT: 22.3 % — AB (ref 36.0–46.0)
HEMOGLOBIN: 6.5 g/dL — AB (ref 12.0–15.0)
Lymphocytes Relative: 42 %
Lymphs Abs: 2.9 10*3/uL (ref 0.7–4.0)
MCH: 18.2 pg — ABNORMAL LOW (ref 26.0–34.0)
MCHC: 29.1 g/dL — ABNORMAL LOW (ref 30.0–36.0)
MCV: 62.5 fL — AB (ref 78.0–100.0)
MONOS PCT: 4 %
Monocytes Absolute: 0.3 10*3/uL (ref 0.1–1.0)
NEUTROS PCT: 54 %
NRBC: 2 /100{WBCs} — AB
Neutro Abs: 3.8 10*3/uL (ref 1.7–7.7)
Platelets: 490 10*3/uL — ABNORMAL HIGH (ref 150–400)
RBC: 3.57 MIL/uL — ABNORMAL LOW (ref 3.87–5.11)
RDW: 20.2 % — ABNORMAL HIGH (ref 11.5–15.5)
WBC: 7 10*3/uL (ref 4.0–10.5)

## 2015-11-01 LAB — COMPREHENSIVE METABOLIC PANEL
ALBUMIN: 3.4 g/dL — AB (ref 3.5–5.0)
ALK PHOS: 48 U/L (ref 38–126)
ALT: 16 U/L (ref 14–54)
ANION GAP: 7 (ref 5–15)
AST: 20 U/L (ref 15–41)
BUN: 8 mg/dL (ref 6–20)
CALCIUM: 8.8 mg/dL — AB (ref 8.9–10.3)
CHLORIDE: 106 mmol/L (ref 101–111)
CO2: 26 mmol/L (ref 22–32)
Creatinine, Ser: 0.85 mg/dL (ref 0.44–1.00)
GFR calc Af Amer: >60 mL/min (ref >60)
GFR calc non Af Amer: >60 mL/min (ref >60)
GLUCOSE: 119 mg/dL — AB (ref 65–99)
POTASSIUM: 3.2 mmol/L — AB (ref 3.5–5.1)
SODIUM: 139 mmol/L (ref 135–145)
Total Bilirubin: 0.2 mg/dL — ABNORMAL LOW (ref 0.3–1.2)
Total Protein: 7.1 g/dL (ref 6.5–8.1)

## 2015-11-01 LAB — WET PREP, GENITAL
CLUE CELLS WET PREP: NONE SEEN
SPERM: NONE SEEN
TRICH WET PREP: NONE SEEN
Yeast Wet Prep HPF POC: NONE SEEN

## 2015-11-01 LAB — URINE MICROSCOPIC-ADD ON

## 2015-11-01 LAB — PREGNANCY, URINE: PREG TEST UR: NEGATIVE

## 2015-11-01 LAB — LIPASE, BLOOD: Lipase: 17 U/L (ref 11–51)

## 2015-11-01 MED ORDER — MEDROXYPROGESTERONE ACETATE 5 MG PO TABS: 20.0000 mg | ORAL_TABLET | Freq: Every day | ORAL | Status: DC

## 2015-11-01 MED ORDER — FERROUS SULFATE 325 (65 FE) MG PO TABS: 325.0000 mg | ORAL_TABLET | Freq: Two times a day (BID) | ORAL | Status: DC

## 2015-11-01 MED ORDER — MORPHINE SULFATE (PF) 4 MG/ML IV SOLN
4.0000 mg | Freq: Once | INTRAVENOUS | Status: AC
  Administered 2015-11-01: 2 mg via INTRAVENOUS
  Filled 2015-11-01: qty 1

## 2015-11-01 MED ORDER — ONDANSETRON HCL 4 MG/2ML IJ SOLN
4.0000 mg | Freq: Once | INTRAMUSCULAR | Status: AC
  Administered 2015-11-01: 4 mg via INTRAVENOUS
  Filled 2015-11-01: qty 2

## 2015-11-01 MED ORDER — SODIUM CHLORIDE 0.9 % IV BOLUS (SEPSIS)
1000.0000 mL | Freq: Once | INTRAVENOUS | Status: AC
  Administered 2015-11-01: 1000 mL via INTRAVENOUS

## 2015-11-01 NOTE — ED Notes (Signed)
Patient transported to Ultrasound 

## 2015-11-01 NOTE — ED Notes (Signed)
Unable to give us a urine sample at the moment

## 2015-11-01 NOTE — ED Notes (Signed)
MD at bedside. 

## 2015-11-01 NOTE — ED Notes (Addendum)
EDP and Marcelino DusterMichelle, rn notified of pt hgb of 6.5

## 2015-11-01 NOTE — ED Notes (Addendum)
C/o abd pain, HA, n/v since last Wednesday-NAD-steady gait-also states vaginal bleeding x 60 days-no GYN f/u

## 2015-11-01 NOTE — ED Provider Notes (Signed)
CSN: [id number]     Arrival date & time 11/01/15  1144 History   First MD Initiated Contact with Patient 11/01/15 1208     Chief Complaint  Patient presents with  . Abdominal Pain   PT HAS HAD ABD PAIN FOR 5 DAYS.  SHE SAID THAT SHE FEELS VERY NAUSEOUS.  THE PT SAID THAT SHE HAS NOT HAD A FEVER.  PT ALSO SAID THAT SHE HAS BEEN ON HER PERIOD FOR ALMOST 60 DAYS.  SHE SAW HER OBGYN (DR. Molly MaduroOBERT Scollard WITH [facility]) WHO PT SAID TOLD HER THAT IT WAS HER BODY READJUSTING FROM BEING ON DEPO, BUT THAT WAS A WHILE AGO.  PT'S PAIN IS ALL UPPER, NOT LOWER.  (Consider location/radiation/quality/duration/timing/severity/associated sxs/prior Treatment) Patient is a 25 y.o. female presenting with abdominal pain. The history is provided by the patient.  Abdominal Pain Pain location:  Epigastric Pain quality: sharp   Pain radiates to:  Does not radiate Pain severity:  Moderate Onset quality:  Sudden Timing:  Constant Chronicity:  New   History reviewed. No pertinent past medical history. History reviewed. No pertinent past surgical history. No family history on file. Social History  Substance Use Topics  . Smoking status: Never Smoker   . Smokeless tobacco: None  . Alcohol Use: No   OB History    Gravida Para Term Preterm AB TAB SAB Ectopic Multiple Living   1              Review of Systems  Gastrointestinal: Positive for abdominal pain.  All other systems reviewed and are negative.     Allergies  Review of patient's allergies indicates no known allergies.  Home Medications   Prior to Admission medications   Medication Sig Start Date End Date Taking? Authorizing Provider  ferrous sulfate 325 (65 FE) MG tablet Take 1 tablet (325 mg total) by mouth 2 (two) times daily. 11/01/15   Jacalyn LefevreJulie Baptiste Littler, MD  medroxyPROGESTERone (PROVERA) 5 MG tablet Take 4 tablets (20 mg total) by mouth daily. 11/01/15   Jacalyn LefevreJulie Uriel Horkey, MD   BP 132/74 mmHg  Pulse 84  Temp(Src) 98.2 F (36.8 C)  (Oral)  Resp 16  Ht 5\' 11"  (1.803 m)  Wt 263 lb (119.296 kg)  BMI 36.70 kg/m2  SpO2 100%  LMP 09/05/2015 Physical Exam  Constitutional: She is oriented to person, place, and time. She appears well-developed and well-nourished.  HENT:  Head: Normocephalic and atraumatic.  Right Ear: External ear normal.  Left Ear: External ear normal.  Nose: Nose normal.  Mouth/Throat: Oropharynx is clear and moist.  Eyes: Conjunctivae and EOM are normal. Pupils are equal, round, and reactive to light.  Neck: Normal range of motion. Neck supple.  Cardiovascular: Normal rate, regular rhythm, normal heart sounds and intact distal pulses.   Pulmonary/Chest: Effort normal and breath sounds normal.  Abdominal: Soft. Bowel sounds are normal. There is tenderness in the right upper quadrant and epigastric area.  Genitourinary: Right adnexum displays no tenderness. Left adnexum displays no tenderness. There is bleeding in the vagina.  Musculoskeletal: Normal range of motion.  Neurological: She is alert and oriented to person, place, and time.  Skin: Skin is warm and dry.  Psychiatric: She has a normal mood and affect. Her behavior is normal. Judgment and thought content normal.  Nursing note and vitals reviewed.   ED Course  Procedures (including critical care time) Labs Review Labs Reviewed  WET PREP, GENITAL - Abnormal; Notable for the following:    WBC, Wet  Prep HPF POC FEW (*)    All other components within normal limits  URINALYSIS, ROUTINE W REFLEX MICROSCOPIC (NOT AT [facility]) - Abnormal; Notable for the following:    APPearance CLOUDY (*)    Hgb urine dipstick LARGE (*)    Leukocytes, UA TRACE (*)    All other components within normal limits  COMPREHENSIVE METABOLIC PANEL - Abnormal; Notable for the following:    Potassium 3.2 (*)    Glucose, Bld 119 (*)    Calcium 8.8 (*)    Albumin 3.4 (*)    Total Bilirubin 0.2 (*)    All other components within normal limits  CBC WITH  DIFFERENTIAL/PLATELET - Abnormal; Notable for the following:    RBC 3.57 (*)    Hemoglobin 6.5 (*)    HCT 22.3 (*)    MCV 62.5 (*)    MCH 18.2 (*)    MCHC 29.1 (*)    RDW 20.2 (*)    Platelets 490 (*)    nRBC 2 (*)    All other components within normal limits  URINE MICROSCOPIC-ADD ON - Abnormal; Notable for the following:    Squamous Epithelial / LPF 6-30 (*)    Bacteria, UA RARE (*)    All other components within normal limits  PREGNANCY, URINE  LIPASE, BLOOD  GC/CHLAMYDIA PROBE AMP ([place]) NOT AT [facility]    Imaging Review US Transvaginal Non-ob  11/01/2015  CLINICAL DATA:  Vaginal bleeding for 2 months with low hemoglobin. EXAM: TRANSABDOMINAL AND TRANSVAGINAL ULTRASOUND OF PELVIS TECHNIQUE: Both transabdominal and transvaginal ultrasound examinations of the pelvis were performed. Transabdominal technique was performed for global imaging of the pelvis including uterus, ovaries, adnexal regions, and pelvic cul-de-sac. It was necessary to proceed with endovaginal exam following the transabdominal exam to visualize the uterus, ovaries, and adnexa. COMPARISON:  None FINDINGS: Uterus Measurements: 6.4 x 3.4 x 4.4 cm. No fibroids or other mass visualized. Endometrium Thickness: Normal, 11 mm.  No focal abnormality visualized. Right ovary Measurements: 3.9 x 2.9 by 2.4 cm. (volume = 14.1 cc). Multiple small peripheral follicles identified. Left ovary Measurements: 3.6 x 3.5 x 2.8 cm (volume = 18.3 cc). Multiple small peripheral follicles identified. Other findings Trace free pelvic fluid is likely physiologic. IMPRESSION: 1. No evidence of endometrial thickening to explain excessive vaginal bleeding. 2. Findings which are highly suspicious for polycystic ovarian syndrome. Electronically Signed   By: Jeronimo Greaves M.D.   On: 11/01/2015 14:55   US Pelvis Complete  11/01/2015  CLINICAL DATA:  Vaginal bleeding for 2 months with low hemoglobin. EXAM: TRANSABDOMINAL AND TRANSVAGINAL ULTRASOUND OF  PELVIS TECHNIQUE: Both transabdominal and transvaginal ultrasound examinations of the pelvis were performed. Transabdominal technique was performed for global imaging of the pelvis including uterus, ovaries, adnexal regions, and pelvic cul-de-sac. It was necessary to proceed with endovaginal exam following the transabdominal exam to visualize the uterus, ovaries, and adnexa. COMPARISON:  None FINDINGS: Uterus Measurements: 6.4 x 3.4 x 4.4 cm. No fibroids or other mass visualized. Endometrium Thickness: Normal, 11 mm.  No focal abnormality visualized. Right ovary Measurements: 3.9 x 2.9 by 2.4 cm. (volume = 14.1 cc). Multiple small peripheral follicles identified. Left ovary Measurements: 3.6 x 3.5 x 2.8 cm (volume = 18.3 cc). Multiple small peripheral follicles identified. Other findings Trace free pelvic fluid is likely physiologic. IMPRESSION: 1. No evidence of endometrial thickening to explain excessive vaginal bleeding. 2. Findings which are highly suspicious for polycystic ovarian syndrome. Electronically Signed   By: Jeronimo Greaves  M.D.   On: 11/01/2015 14:55   US Abdomen Limited Ruq  11/01/2015  CLINICAL DATA:  25 year old female with history of epigastric pain and nausea for the past 5 days. EXAM: US ABDOMEN LIMITED - RIGHT UPPER QUADRANT COMPARISON:  No priors. FINDINGS: Gallbladder: No gallstones or wall thickening visualized. No sonographic Murphy sign noted by sonographer. Common bile duct: Diameter: 3 mm Liver: No focal lesion identified. Diffusely increased hepatic echogenicity, suggestive of hepatic steatosis. Normal hepatopetal flow in the portal vein. IMPRESSION: 1. Diffusely increased hepatic echogenicity, suggestive of probable hepatic steatosis. 2. No evidence of cholelithiasis or findings to suggest an acute cholecystitis. Electronically Signed   By: Trudie Reed M.D.   On: 11/01/2015 14:47   I have personally reviewed and evaluated these images and lab results as part of my medical  decision-making.   EKG Interpretation None      MDM  I ATTEMPTED TO MAKE CONTACT WITH DR. Okey Dupre, BUT NO ONE IS ON CALL FOR HIM AS HE IS SEMI-RETIRED.  I SPOKE WITH DR. Adrian Blackwater WHO IS ON CALL FOR OBGYN HERE AND HE REC PROVERA AND IRON AND TO F/U WITH HIM IN THE NEXT DAY OR SO.  PT IS TO RETURN IF WORSE. Final diagnoses:  Pain  DUB (dysfunctional uterine bleeding)  Iron deficiency anemia due to chronic blood loss  PCOS (polycystic ovarian syndrome)       Jacalyn Lefevre, MD 11/01/15 [phone]

## 2015-11-01 NOTE — Discharge Instructions (Signed)
Abnormal Uterine Bleeding °Abnormal uterine bleeding means bleeding from the vagina that is not your normal menstrual period. This can be: °· Bleeding or spotting between periods. °· Bleeding after sex (sexual intercourse). °· Bleeding that is heavier or more than normal. °· Periods that last longer than usual. °· Bleeding after menopause. °There are many problems that may cause this. Treatment will depend on the cause of the bleeding. Any kind of bleeding that is not normal should be reviewed by your doctor.  °HOME CARE °Watch your condition for any changes. These actions may lessen any discomfort you are having: °· Do not use tampons or douches as told by your doctor. °· Change your pads often. °You should get regular pelvic exams and Pap tests. Keep all appointments for tests as told by your doctor. °GET HELP IF: °· You are bleeding for more than 1 week. °· You feel dizzy at times. °GET HELP RIGHT AWAY IF:  °· You pass out. °· You have to change pads every 15 to 30 minutes. °· You have belly pain. °· You have a fever. °· You become sweaty or weak. °· You are passing large blood clots from the vagina. °· You feel sick to your stomach (nauseous) and throw up (vomit). °MAKE SURE YOU: °· Understand these instructions. °· Will watch your condition. °· Will get help right away if you are not doing well or get worse. °  °This information is not intended to replace advice given to you by your health care provider. Make sure you discuss any questions you have with your health care provider. °  °Document Released: 03/19/2009 Document Revised: 05/27/2013 Document Reviewed: 12/19/2012 °Elsevier Interactive Patient Education ©2016 [employer] ° °

## 2015-11-02 LAB — GC/CHLAMYDIA PROBE AMP (~~LOC~~) NOT AT ARMC
Chlamydia: NEGATIVE
Neisseria Gonorrhea: NEGATIVE

## 2015-11-29 ENCOUNTER — Encounter: Payer: Medicaid Other | Admitting: Family Medicine

## 2017-01-29 ENCOUNTER — Emergency Department
Admission: EM | Admit: 2017-01-29 | Discharge: 2017-01-29 | Disposition: A | Discharge status: Home / Self Care | Payer: No Typology Code available for payment source | Attending: Emergency Medicine | Admitting: Emergency Medicine

## 2017-01-29 DIAGNOSIS — Y929 Unspecified place or not applicable: Secondary | ICD-10-CM | POA: Diagnosis not present

## 2017-01-29 DIAGNOSIS — Z041 Encounter for examination and observation following transport accident: Secondary | ICD-10-CM | POA: Insufficient documentation

## 2017-01-29 DIAGNOSIS — S39012A Strain of muscle, fascia and tendon of lower back, initial encounter: Secondary | ICD-10-CM | POA: Diagnosis not present

## 2017-01-29 DIAGNOSIS — Y9389 Activity, other specified: Secondary | ICD-10-CM | POA: Diagnosis not present

## 2017-01-29 DIAGNOSIS — Y998 Other external cause status: Secondary | ICD-10-CM | POA: Insufficient documentation

## 2017-01-29 DIAGNOSIS — S3992XA Unspecified injury of lower back, initial encounter: Secondary | ICD-10-CM | POA: Diagnosis present

## 2017-01-29 MED ORDER — NAPROXEN 500 MG PO TABS: 500.0000 mg | ORAL_TABLET | Freq: Two times a day (BID) | ORAL | 0 refills | Status: DC

## 2017-01-29 MED ORDER — CYCLOBENZAPRINE HCL 10 MG PO TABS: 10.0000 mg | ORAL_TABLET | Freq: Three times a day (TID) | ORAL | 0 refills | Status: DC | PRN

## 2017-01-29 MED ORDER — NAPROXEN 500 MG PO TABS
500.0000 mg | ORAL_TABLET | Freq: Once | ORAL | Status: AC
  Administered 2017-01-29: 500 mg via ORAL
  Filled 2017-01-29: qty 1

## 2017-01-29 MED ORDER — CYCLOBENZAPRINE HCL 10 MG PO TABS
10.0000 mg | ORAL_TABLET | Freq: Once | ORAL | Status: AC
  Administered 2017-01-29: 10 mg via ORAL
  Filled 2017-01-29: qty 1
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
## 2017-02-08 ENCOUNTER — Encounter (HOSPITAL_COMMUNITY): Payer: Self-pay | Admitting: Emergency Medicine

## 2017-02-08 ENCOUNTER — Emergency Department (HOSPITAL_COMMUNITY)
Admission: EM | Admit: 2017-02-08 | Discharge: 2017-02-08 | Disposition: A | Discharge status: Home / Self Care | Payer: Self-pay | Attending: Emergency Medicine | Admitting: Emergency Medicine

## 2017-02-08 DIAGNOSIS — Z5321 Procedure and treatment not carried out due to patient leaving prior to being seen by health care provider: Secondary | ICD-10-CM | POA: Insufficient documentation

## 2017-02-08 DIAGNOSIS — R202 Paresthesia of skin: Secondary | ICD-10-CM | POA: Insufficient documentation

## 2017-02-08 DIAGNOSIS — R42 Dizziness and giddiness: Secondary | ICD-10-CM | POA: Insufficient documentation

## 2017-02-08 LAB — URINALYSIS, ROUTINE W REFLEX MICROSCOPIC
BILIRUBIN URINE: NEGATIVE
Bacteria, UA: NONE SEEN
GLUCOSE, UA: NEGATIVE mg/dL
Hgb urine dipstick: NEGATIVE
KETONES UR: NEGATIVE mg/dL
LEUKOCYTES UA: NEGATIVE
Nitrite: NEGATIVE
PROTEIN: 30 mg/dL — AB
Specific Gravity, Urine: 1.02 (ref 1.005–1.030)
pH: 8 (ref 5.0–8.0)

## 2017-02-08 LAB — BASIC METABOLIC PANEL
Anion gap: 8 (ref 5–15)
BUN: 7 mg/dL (ref 6–20)
CALCIUM: 9 mg/dL (ref 8.9–10.3)
CHLORIDE: 105 mmol/L (ref 101–111)
CO2: 25 mmol/L (ref 22–32)
CREATININE: 1.04 mg/dL — AB (ref 0.44–1.00)
GFR calc non Af Amer: >60 mL/min (ref >60)
GLUCOSE: 113 mg/dL — AB (ref 65–99)
Potassium: 3.5 mmol/L (ref 3.5–5.1)
Sodium: 138 mmol/L (ref 135–145)

## 2017-02-08 LAB — CBC
HEMATOCRIT: 34.6 % — AB (ref 36.0–46.0)
HEMOGLOBIN: 11.3 g/dL — AB (ref 12.0–15.0)
MCH: 26.7 pg (ref 26.0–34.0)
MCHC: 32.7 g/dL (ref 30.0–36.0)
MCV: 81.6 fL (ref 78.0–100.0)
Platelets: 389 10*3/uL (ref 150–400)
RBC: 4.24 MIL/uL (ref 3.87–5.11)
RDW: 13.6 % (ref 11.5–15.5)
WBC: 6.2 10*3/uL (ref 4.0–10.5)

## 2017-02-08 NOTE — ED Triage Notes (Signed)
Pt reports that her left arm is numb not even able to feel the blood pressure cuff.  She is able to move her arm and other extremities.  Also states she feels like she is going to pass out, has eaten today.  Noticed the feeling around 3pm was able to finish her shift at work before coming in.

## 2017-02-08 NOTE — ED Notes (Signed)
Patient's mother at desk saying that her daughter feels better and wants to leave. Patient says she has feeling in her right arm now. Mother says she will bring daughter back if she gets worse

## 2017-03-08 ENCOUNTER — Emergency Department (HOSPITAL_BASED_OUTPATIENT_CLINIC_OR_DEPARTMENT_OTHER)
Admission: EM | Admit: 2017-03-08 | Discharge: 2017-03-08 | Disposition: A | Discharge status: Home / Self Care | Payer: Self-pay | Attending: Emergency Medicine | Admitting: Emergency Medicine

## 2017-03-08 ENCOUNTER — Encounter (HOSPITAL_BASED_OUTPATIENT_CLINIC_OR_DEPARTMENT_OTHER): Payer: Self-pay | Admitting: *Deleted

## 2017-03-08 DIAGNOSIS — N760 Acute vaginitis: Secondary | ICD-10-CM | POA: Insufficient documentation

## 2017-03-08 DIAGNOSIS — Z711 Person with feared health complaint in whom no diagnosis is made: Secondary | ICD-10-CM

## 2017-03-08 DIAGNOSIS — N898 Other specified noninflammatory disorders of vagina: Secondary | ICD-10-CM

## 2017-03-08 DIAGNOSIS — B9689 Other specified bacterial agents as the cause of diseases classified elsewhere: Secondary | ICD-10-CM

## 2017-03-08 LAB — URINALYSIS, ROUTINE W REFLEX MICROSCOPIC
Bilirubin Urine: NEGATIVE
GLUCOSE, UA: NEGATIVE mg/dL
KETONES UR: NEGATIVE mg/dL
LEUKOCYTES UA: NEGATIVE
Nitrite: NEGATIVE
PH: 7.5 (ref 5.0–8.0)
Protein, ur: NEGATIVE mg/dL
SPECIFIC GRAVITY, URINE: 1.015 (ref 1.005–1.030)

## 2017-03-08 LAB — URINALYSIS, MICROSCOPIC (REFLEX)

## 2017-03-08 LAB — WET PREP, GENITAL
SPERM: NONE SEEN
TRICH WET PREP: NONE SEEN
Yeast Wet Prep HPF POC: NONE SEEN

## 2017-03-08 LAB — PREGNANCY, URINE: Preg Test, Ur: NEGATIVE

## 2017-03-08 MED ORDER — CEFTRIAXONE SODIUM 250 MG IJ SOLR
250.0000 mg | Freq: Once | INTRAMUSCULAR | Status: AC
  Administered 2017-03-08: 250 mg via INTRAMUSCULAR
  Filled 2017-03-08: qty 250

## 2017-03-08 MED ORDER — METRONIDAZOLE 500 MG PO TABS: 500.0000 mg | ORAL_TABLET | Freq: Two times a day (BID) | ORAL | 0 refills | Status: DC

## 2017-03-08 MED ORDER — AZITHROMYCIN 250 MG PO TABS
1000.0000 mg | ORAL_TABLET | Freq: Once | ORAL | Status: AC
  Administered 2017-03-08: 1000 mg via ORAL
  Filled 2017-03-08: qty 4

## 2017-03-08 MED FILL — metroNIDAZOLE 500 MG TABS: 500 | 7 days supply | Qty: 14 | Fill #0

## 2017-03-08 NOTE — ED Provider Notes (Signed)
[facility] Provider Note   CSN: [id number] Arrival date & time: 03/08/17  1010     History   Chief Complaint Chief Complaint  Patient presents with  . Vaginal Discharge    HPI Rachel Russo is a 26 y.o. female.  HPI   Patient is a 26 year old female with no significant past medical history presenting for vaginal discharge that has been going on for 2 months and dysuria. Patient reports she has had some lower abdominal discomfort and feeling of incomplete emptying with urination for 2-3 days. Patient has not tried anything for her symptoms. Patient reports she has had a clear vaginal discharge for 2 months. Patient denies any vaginal bleeding at this time, but does report postcoital spotting. Patient denies any fever or chills. Patient reports she has recently had some nausea in the evening but no vomiting.   History reviewed. No pertinent past medical history.  There are no active problems to display for this patient.   History reviewed. No pertinent surgical history.  OB History    Gravida Para Term Preterm AB Living   1             SAB TAB Ectopic Multiple Live Births                   Home Medications    Prior to Admission medications   Not on File    Family History History reviewed. No pertinent family history.  Social History Social History  Substance Use Topics  . Smoking status: Never Smoker  . Smokeless tobacco: Never Used  . Alcohol use No     Allergies   Patient has no known allergies.   Review of Systems Review of Systems   Physical Exam Updated Vital Signs BP (!) 120/58 (BP Location: Right Arm)   Pulse 85   Temp 98 F (36.7 C) (Oral)   Resp 16   LMP 01/24/2017   SpO2 99%   Physical Exam  Constitutional: She appears well-developed and well-nourished. No distress.  Resting comfortably in bed.  HENT:  Head: Normocephalic and atraumatic.  Mouth/Throat: Oropharynx is clear and moist.  Eyes: Pupils are equal,  round, and reactive to light. Conjunctivae and EOM are normal.  Neck: Normal range of motion. Neck supple.  Cardiovascular: Normal rate, regular rhythm, S1 normal and S2 normal.   No murmur heard. Pulmonary/Chest: Effort normal and breath sounds normal. She has no wheezes. She has no rales.  Abdominal: Soft. She exhibits no distension. There is no tenderness. There is no guarding.  Genitourinary:    Genitourinary Comments: Exam performed with nurse chaperone present. No external lesions of the perineum or vulva. No inguinal lymphadenopathy palpated. No lesions of the vaginal wall. Approximately 3 mm in diameter white lesion present in the 6:00 position on the cervix. Cervix nonfriable. Small amount of clear discharge with streaks of blood present around cervix. No adnexal tenderness bilaterally, uterine fundus and tenderness, or cervical motion tenderness.  Musculoskeletal: Normal range of motion. She exhibits no edema or deformity.  Lymphadenopathy:    She has no cervical adenopathy.  Neurological: She is alert.  Cranial nerves grossly intact. Patient moves extremities with good coordination and without difficulty.  Skin: Skin is warm and dry. No rash noted. No erythema.  Psychiatric: She has a normal mood and affect. Her behavior is normal. Judgment and thought content normal.  Nursing note and vitals reviewed.    ED Treatments / Results  Labs (all labs ordered are listed,  but only abnormal results are displayed) Labs Reviewed  WET PREP, GENITAL  PREGNANCY, URINE  URINALYSIS, ROUTINE W REFLEX MICROSCOPIC  GC/CHLAMYDIA PROBE AMP ([place]) NOT AT [facility]    EKG  EKG Interpretation None       Radiology No results found.  Procedures Procedures (including critical care time)  Medications Ordered in ED Medications - No data to display   Initial Impression / Assessment and Plan / ED Course  I have reviewed the triage vital signs and the nursing notes.  Pertinent labs &  imaging results that were available during my care of the patient were reviewed by me and considered in my medical decision making (see chart for details).    Final Clinical Impressions(s) / ED Diagnoses   Final diagnoses:  None   MDM  Differential diagnosis includes cystitis, interstitial cystitis, bacterial vaginosis, cervical polyp, STI.   Patient with negative urine for infection. Wet prep demonstrates clue cells and moderate WBCs. Patient well-appearing, afebrile, and in no acute distress. Discussed prophylactic treatment for STI with patient. Patient elected to proceed with prophylaxis today and declined syphilis and HIV testing. Patient noted to have white lesion in the 6:00 position on cervical exam. Discussed this with patient and the need to follow up with a provider and primary care OB/GYN. Referrals provided. Patient given strict precautions for return for worsening abdominal pain, fever, chills, nausea, vomiting in the setting of her abdominal pain. Patient informed of need for partner notification if any test results are positive. Patient is in understanding and agrees with the plan of care.  New Prescriptions New Prescriptions   No medications on file     Delia Chimes 03/08/17 1330    Loren Racer, MD 03/09/17 [phone]

## 2017-03-08 NOTE — Discharge Instructions (Addendum)
Medications   He was given azithromycin and ceftriaxone, which treat gonorrhea and Chlamydia. Please take Flagyl for 7 days. Please do not take this medicine with alcohol, as it can make you feel very sick.  Your urine is normal today.  I would like a gynecologist or primary care provider to follow up on the spot on the cervix. Please ensure that your Pap smears are up-to-date and contact her prior office to ensure that you have had this done. There is a resource for gynecology as well as [employer] and wellness listed in this paperwork.  Use a condom with every sexual encounter Follow up with your doctor OBGYN in regards to today's visit.   Please return to the ER for worsening symptoms, high fevers or persistent vomiting.  You have been tested for chlamydia and gonorrhea. These results will be available in approximately 3 days. You will be notified if they are positive.   It is very important to practice safe sex and use condoms when sexually active. If your results are positive you need to notify all sexual partners so they can be treated as well. The website https://garcia.net/ can be used to send anonymous text messages or emails to alert sexual contacts.   SEEK IMMEDIATE MEDICAL CARE IF:  You develop an oral temperature above 102 F (38.9 C), not controlled by medications or lasting more than 2 days.  You develop an increase in pain.  You develop vaginal bleeding and it is not time for your period.  You develop painful intercourse.

## 2017-03-08 NOTE — ED Triage Notes (Signed)
Pt reports clear vag discharge with occassional itching to her vagina "for years", states "I googled it and I think my ph balance is off." pt also reports urgency and frequency also, urine sample sent.

## 2017-03-08 NOTE — ED Notes (Signed)
ED Provider at bedside. 

## 2017-03-09 LAB — GC/CHLAMYDIA PROBE AMP (~~LOC~~) NOT AT ARMC
CHLAMYDIA, DNA PROBE: NEGATIVE
Neisseria Gonorrhea: NEGATIVE

## 2017-07-26 ENCOUNTER — Emergency Department (HOSPITAL_BASED_OUTPATIENT_CLINIC_OR_DEPARTMENT_OTHER)
Admission: EM | Admit: 2017-07-26 | Discharge: 2017-07-26 | Disposition: A | Discharge status: Home / Self Care | Payer: Self-pay | Attending: Emergency Medicine | Admitting: Emergency Medicine

## 2017-07-26 ENCOUNTER — Encounter (HOSPITAL_BASED_OUTPATIENT_CLINIC_OR_DEPARTMENT_OTHER): Payer: Self-pay | Admitting: Emergency Medicine

## 2017-07-26 ENCOUNTER — Other Ambulatory Visit: Payer: Self-pay

## 2017-07-26 ENCOUNTER — Encounter (HOSPITAL_COMMUNITY): Payer: Self-pay | Admitting: Emergency Medicine

## 2017-07-26 ENCOUNTER — Emergency Department (HOSPITAL_COMMUNITY)
Admission: EM | Admit: 2017-07-26 | Discharge: 2017-07-26 | Disposition: A | Discharge status: Home / Self Care | Payer: Self-pay | Attending: Emergency Medicine | Admitting: Emergency Medicine

## 2017-07-26 DIAGNOSIS — R109 Unspecified abdominal pain: Secondary | ICD-10-CM | POA: Insufficient documentation

## 2017-07-26 DIAGNOSIS — K529 Noninfective gastroenteritis and colitis, unspecified: Secondary | ICD-10-CM | POA: Insufficient documentation

## 2017-07-26 DIAGNOSIS — Z5321 Procedure and treatment not carried out due to patient leaving prior to being seen by health care provider: Secondary | ICD-10-CM | POA: Insufficient documentation

## 2017-07-26 LAB — URINALYSIS, ROUTINE W REFLEX MICROSCOPIC
Bilirubin Urine: NEGATIVE
GLUCOSE, UA: NEGATIVE mg/dL
Hgb urine dipstick: NEGATIVE
Ketones, ur: 15 mg/dL — AB
LEUKOCYTES UA: NEGATIVE
NITRITE: NEGATIVE
PROTEIN: NEGATIVE mg/dL
Specific Gravity, Urine: >1.03 — ABNORMAL HIGH (ref 1.005–1.030)
pH: 6 (ref 5.0–8.0)

## 2017-07-26 LAB — CBC WITH DIFFERENTIAL/PLATELET
BASOS ABS: 0 10*3/uL (ref 0.0–0.1)
Basophils Relative: 0 %
Eosinophils Absolute: 0 10*3/uL (ref 0.0–0.7)
Eosinophils Relative: 0 %
HCT: 32.9 % — ABNORMAL LOW (ref 36.0–46.0)
Hemoglobin: 10.5 g/dL — ABNORMAL LOW (ref 12.0–15.0)
LYMPHS PCT: 15 %
Lymphs Abs: 1.2 10*3/uL (ref 0.7–4.0)
MCH: 21.1 pg — ABNORMAL LOW (ref 26.0–34.0)
MCHC: 31.9 g/dL (ref 30.0–36.0)
MCV: 66.2 fL — ABNORMAL LOW (ref 78.0–100.0)
MONO ABS: 0.5 10*3/uL (ref 0.1–1.0)
Monocytes Relative: 6 %
NEUTROS ABS: 6.3 10*3/uL (ref 1.7–7.7)
Neutrophils Relative %: 79 %
PLATELETS: 463 10*3/uL — AB (ref 150–400)
RBC: 4.97 MIL/uL (ref 3.87–5.11)
RDW: 22 % — ABNORMAL HIGH (ref 11.5–15.5)
WBC: 8.1 10*3/uL (ref 4.0–10.5)

## 2017-07-26 LAB — COMPREHENSIVE METABOLIC PANEL
ALBUMIN: 3.9 g/dL (ref 3.5–5.0)
ALT: 16 U/L (ref 14–54)
AST: 18 U/L (ref 15–41)
Alkaline Phosphatase: 55 U/L (ref 38–126)
Anion gap: 11 (ref 5–15)
BILIRUBIN TOTAL: 0.5 mg/dL (ref 0.3–1.2)
BUN: 9 mg/dL (ref 6–20)
CO2: 24 mmol/L (ref 22–32)
Calcium: 9.1 mg/dL (ref 8.9–10.3)
Chloride: 104 mmol/L (ref 101–111)
Creatinine, Ser: 0.95 mg/dL (ref 0.44–1.00)
GFR calc Af Amer: >60 mL/min (ref >60)
GFR calc non Af Amer: >60 mL/min (ref >60)
GLUCOSE: 103 mg/dL — AB (ref 65–99)
POTASSIUM: 3.1 mmol/L — AB (ref 3.5–5.1)
Sodium: 139 mmol/L (ref 135–145)
TOTAL PROTEIN: 7.7 g/dL (ref 6.5–8.1)

## 2017-07-26 LAB — PREGNANCY, URINE: Preg Test, Ur: NEGATIVE

## 2017-07-26 MED ORDER — ONDANSETRON 4 MG PO TBDP: ORAL_TABLET | ORAL | 0 refills | Status: DC

## 2017-07-26 MED ORDER — POTASSIUM CHLORIDE CRYS ER 20 MEQ PO TBCR
40.0000 meq | EXTENDED_RELEASE_TABLET | Freq: Once | ORAL | Status: AC
  Administered 2017-07-26: 40 meq via ORAL
  Filled 2017-07-26: qty 2

## 2017-07-26 MED ORDER — KETOROLAC TROMETHAMINE 30 MG/ML IJ SOLN: 30.0000 mg | Freq: Once | INTRAMUSCULAR | Status: DC

## 2017-07-26 MED ORDER — SODIUM CHLORIDE 0.9 % IV BOLUS (SEPSIS)
1000.0000 mL | Freq: Once | INTRAVENOUS | Status: AC
  Administered 2017-07-26: 1000 mL via INTRAVENOUS

## 2017-07-26 MED ORDER — ONDANSETRON HCL 4 MG/2ML IJ SOLN
4.0000 mg | Freq: Once | INTRAMUSCULAR | Status: AC
  Administered 2017-07-26: 4 mg via INTRAVENOUS
  Filled 2017-07-26: qty 2

## 2017-07-26 MED ORDER — KETOROLAC TROMETHAMINE 30 MG/ML IJ SOLN
30.0000 mg | Freq: Once | INTRAMUSCULAR | Status: AC
  Administered 2017-07-26: 30 mg via INTRAVENOUS
  Filled 2017-07-26: qty 1

## 2017-07-26 NOTE — ED Provider Notes (Signed)
[facility] EMERGENCY DEPARTMENT Provider Note   CSN: [id number] Arrival date & time: 07/26/17  1653     History   Chief Complaint Chief Complaint  Patient presents with  . Abdominal Pain    HPI Stephene Alegria is a 27 y.o. female.  Patient is a 27 year old female who presents with vomiting and abdominal pain.  She states that last night she ate a cookout tray and following that had onset of vomiting and diarrhea.  She had some ongoing vomiting and watery diarrhea through the night.  She now is having some pain in her left side.  This started after the vomiting and diarrhea.  She states it radiates a little bit to her back.  She denies any urinary symptoms.  No hematuria.  No known fevers.  No history of recent travel outside the country.  She has not taken anything at home for the pain.      History reviewed. No pertinent past medical history.  There are no active problems to display for this patient.   History reviewed. No pertinent surgical history.  OB History    Gravida Para Term Preterm AB Living   1             SAB TAB Ectopic Multiple Live Births                   Home Medications    Prior to Admission medications   Medication Sig Start Date End Date Taking? Authorizing Provider  metroNIDAZOLE (FLAGYL) 500 MG tablet Take 1 tablet (500 mg total) by mouth 2 (two) times daily. 03/08/17   Aviva Kluver B, PA-C  ondansetron (ZOFRAN ODT) 4 MG disintegrating tablet 4mg  ODT q4 hours prn nausea/vomit 07/26/17   Rolan Bucco, MD    Family History History reviewed. No pertinent family history.  Social History Social History   Tobacco Use  . Smoking status: Never Smoker  . Smokeless tobacco: Never Used  Substance Use Topics  . Alcohol use: No  . Drug use: No     Allergies   Patient has no known allergies.   Review of Systems Review of Systems  Constitutional: Negative for chills, diaphoresis, fatigue and fever.  HENT: Negative for  congestion, rhinorrhea and sneezing.   Eyes: Negative.   Respiratory: Negative for cough, chest tightness and shortness of breath.   Cardiovascular: Negative for chest pain and leg swelling.  Gastrointestinal: Positive for abdominal pain, diarrhea, nausea and vomiting. Negative for blood in stool.  Genitourinary: Negative for difficulty urinating, flank pain, frequency and hematuria.  Musculoskeletal: Negative for arthralgias and back pain.  Skin: Negative for rash.  Neurological: Negative for dizziness, speech difficulty, weakness, numbness and headaches.     Physical Exam Updated Vital Signs BP 129/69 (BP Location: Left Arm)   Pulse 88   Temp 99.7 F (37.6 C) (Oral)   Resp 20   Ht 5\' 10"  (1.778 m)   Wt 117.9 kg (260 lb)   LMP 07/14/2017   SpO2 98%   BMI 37.31 kg/m   Physical Exam  Constitutional: She is oriented to person, place, and time. She appears well-developed and well-nourished.  HENT:  Head: Normocephalic and atraumatic.  Eyes: Pupils are equal, round, and reactive to light.  Neck: Normal range of motion. Neck supple.  Cardiovascular: Normal rate, regular rhythm and normal heart sounds.  Pulmonary/Chest: Effort normal and breath sounds normal. No respiratory distress. She has no wheezes. She has no rales. She exhibits no tenderness.  Abdominal:  Soft. Bowel sounds are normal. There is tenderness (Positive tenderness in the left flank including left upper and lower abdomen). There is no rebound and no guarding.  Musculoskeletal: Normal range of motion. She exhibits no edema.  Lymphadenopathy:    She has no cervical adenopathy.  Neurological: She is alert and oriented to person, place, and time.  Skin: Skin is warm and dry. No rash noted.  Psychiatric: She has a normal mood and affect.     ED Treatments / Results  Labs (all labs ordered are listed, but only abnormal results are displayed) Labs Reviewed  URINALYSIS, ROUTINE W REFLEX MICROSCOPIC - Abnormal;  Notable for the following components:      Result Value   APPearance HAZY (*)    Specific Gravity, Urine >1.030 (*)    Ketones, ur 15 (*)    All other components within normal limits  COMPREHENSIVE METABOLIC PANEL - Abnormal; Notable for the following components:   Potassium 3.1 (*)    Glucose, Bld 103 (*)    All other components within normal limits  CBC WITH DIFFERENTIAL/PLATELET - Abnormal; Notable for the following components:   Hemoglobin 10.5 (*)    HCT 32.9 (*)    MCV 66.2 (*)    MCH 21.1 (*)    RDW 22.0 (*)    Platelets 463 (*)    All other components within normal limits  PREGNANCY, URINE    EKG  EKG Interpretation None       Radiology No results found.  Procedures Procedures (including critical care time)  Medications Ordered in ED Medications  ondansetron (ZOFRAN) injection 4 mg (4 mg Intravenous Given 07/26/17 2053)  sodium chloride 0.9 % bolus 1,000 mL (0 mLs Intravenous Stopped 07/26/17 2158)  ketorolac (TORADOL) 30 MG/ML injection 30 mg (30 mg Intravenous Given 07/26/17 2159)  potassium chloride SA (K-DUR,KLOR-CON) CR tablet 40 mEq (40 mEq Oral Given 07/26/17 2158)     Initial Impression / Assessment and Plan / ED Course  I have reviewed the triage vital signs and the nursing notes.  Pertinent labs & imaging results that were available during my care of the patient were reviewed by me and considered in my medical decision making (see chart for details).     Patient is a 27 year old female who presents with vomiting diarrhea and some left-sided abdominal cramping.  She was given IV fluids as well as Zofran and Toradol.  Her symptoms have markedly improved.  Her repeat abdominal exam is benign.  She is able to tolerate oral fluids without vomiting.  Her labs show some mild anemia which she says is chronic for her.  She had a slightly low potassium and was given a dose of potassium in the ED.  This is likely from her diarrhea.  Her other labs are  non-concerning.  This is likely viral gastroenteritis.  She was discharged home in good condition.  Return precautions were given.  She was advised to use clear liquid diet for the next 24 hours.  Final Clinical Impressions(s) / ED Diagnoses   Final diagnoses:  Gastroenteritis    ED Discharge Orders        Ordered    ondansetron (ZOFRAN ODT) 4 MG disintegrating tablet     07/26/17 2232       Rolan BuccoBelfi, Calisha Tindel, MD 07/26/17 2235

## 2017-07-26 NOTE — ED Notes (Signed)
Patient refused lab draw.

## 2017-07-26 NOTE — ED Triage Notes (Signed)
Per EMS complaint of LLQ pain with n/v/d onset yesterday.

## 2017-07-26 NOTE — ED Notes (Signed)
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

## 2017-07-26 NOTE — ED Triage Notes (Signed)
Patient states that she is having N/V/ with abdominal pain since about 1 am

## 2017-07-31 IMAGING — US US ABDOMEN LIMITED
1 series · 14 of 25 positions shown · non-contrast
Comparison: No priors.

CLINICAL DATA: 24-year-old female with history of epigastric pain
and nausea for the past 5 days.

EXAM:
US ABDOMEN LIMITED - RIGHT UPPER QUADRANT

[Series 1: us abdomen limited · 0.20mm/px · 14 of 33 slices shown]
[im 1/33]
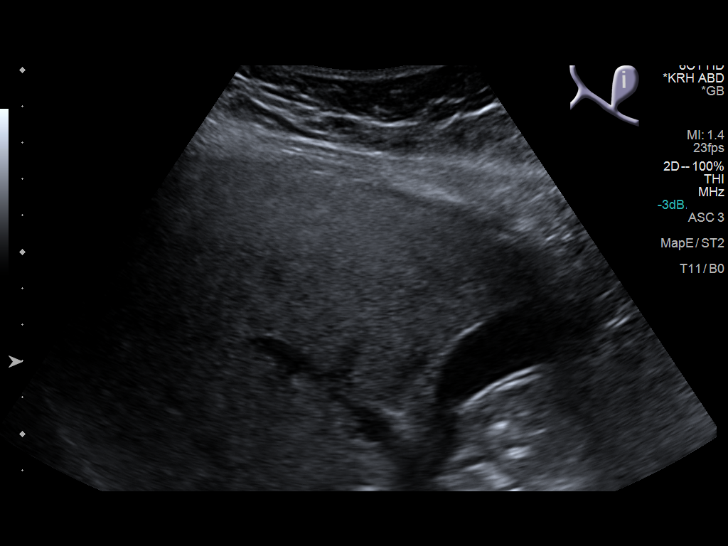
[im 3/33]
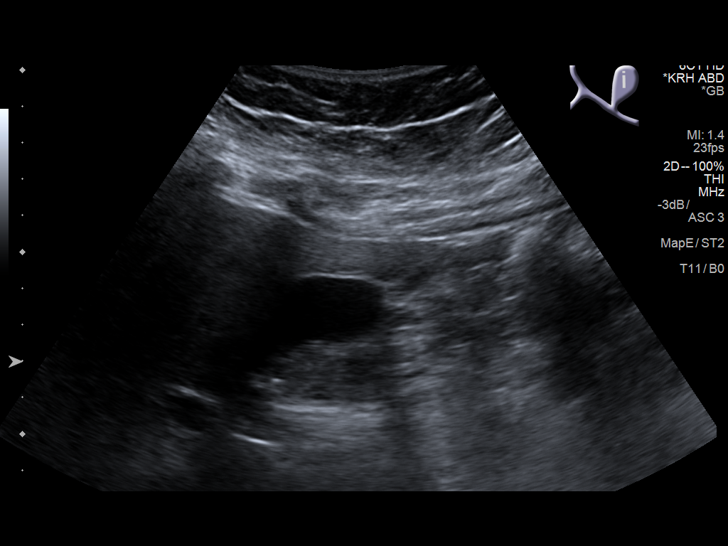
[im 6/33]
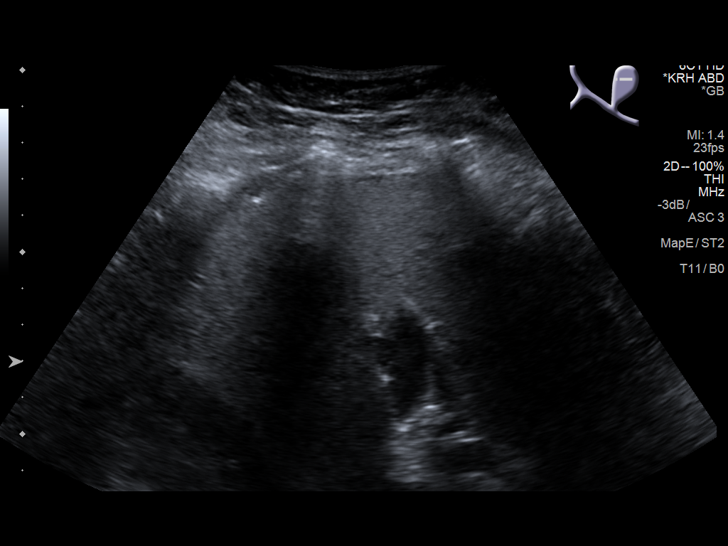
[im 9/33]
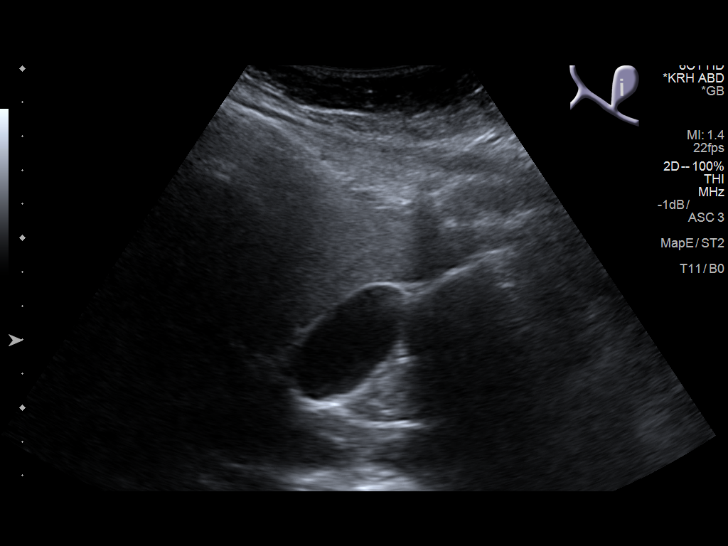
[im 11/33]
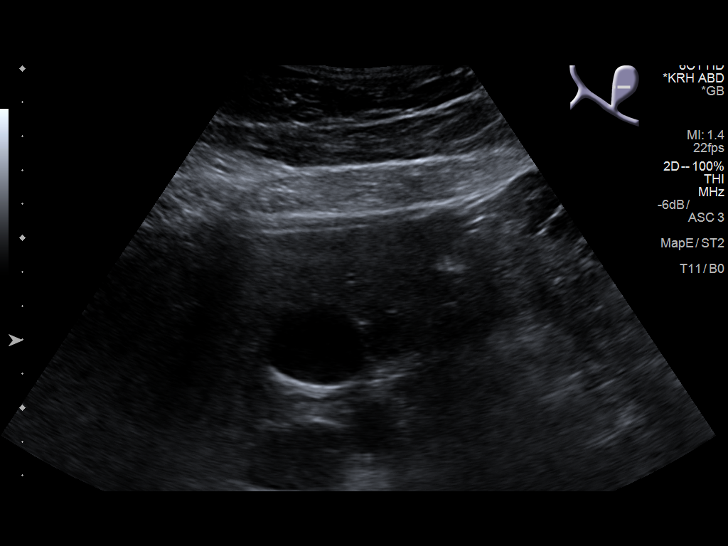
[im 13/33]
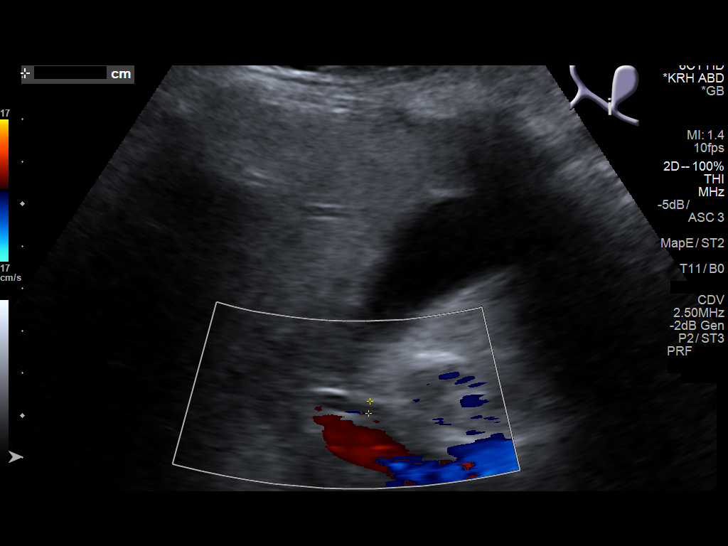
[im 15/33]
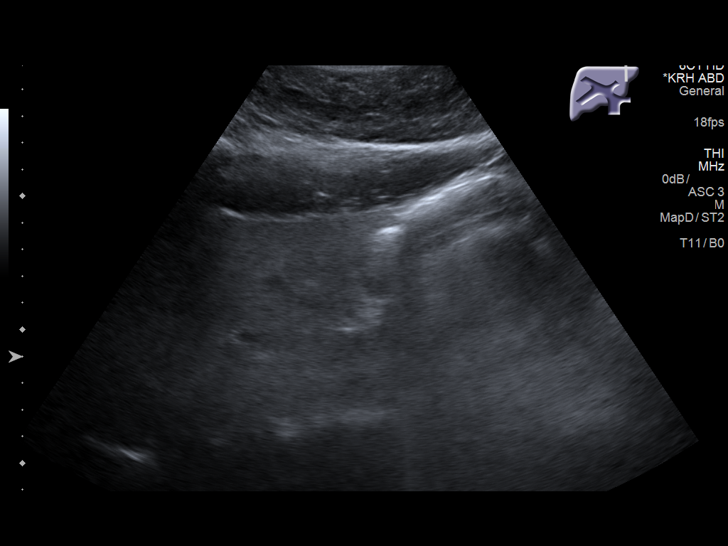
[im 18/33]
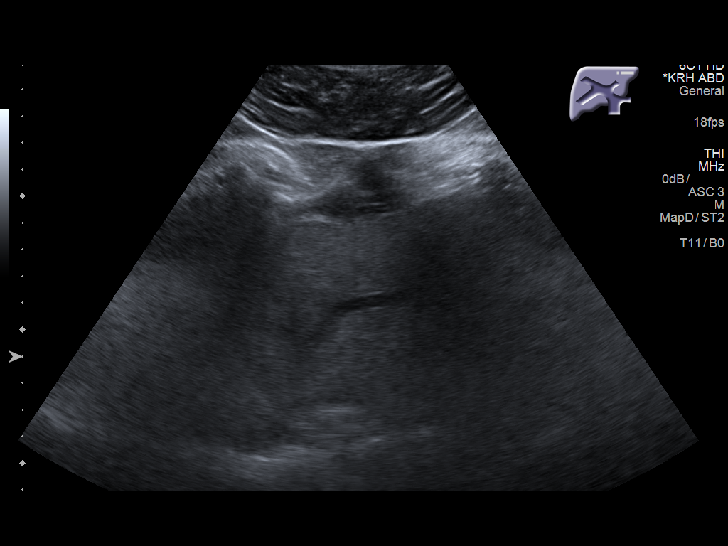
[im 21/33]
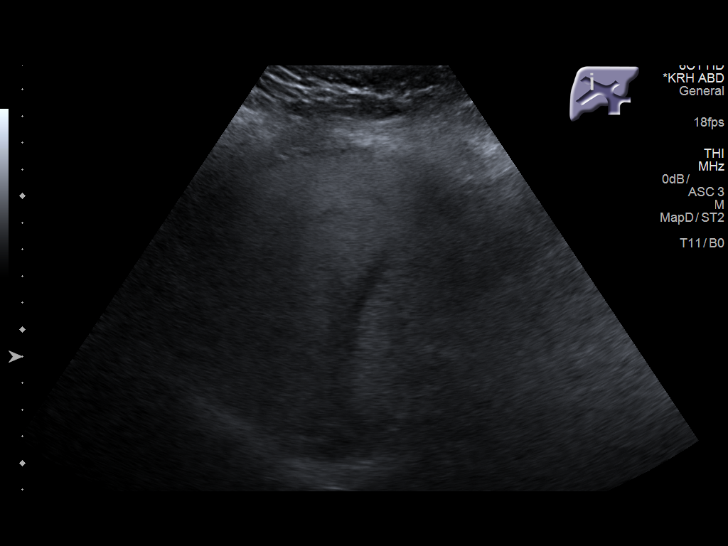
[im 22/33]
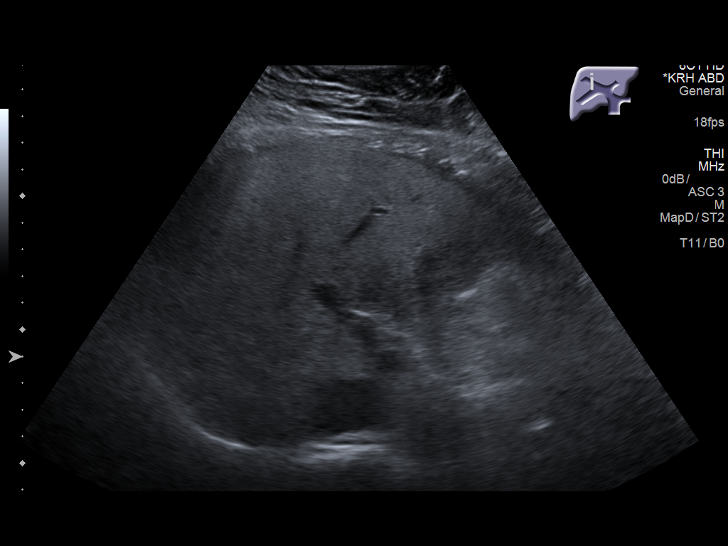
[im 25/33]
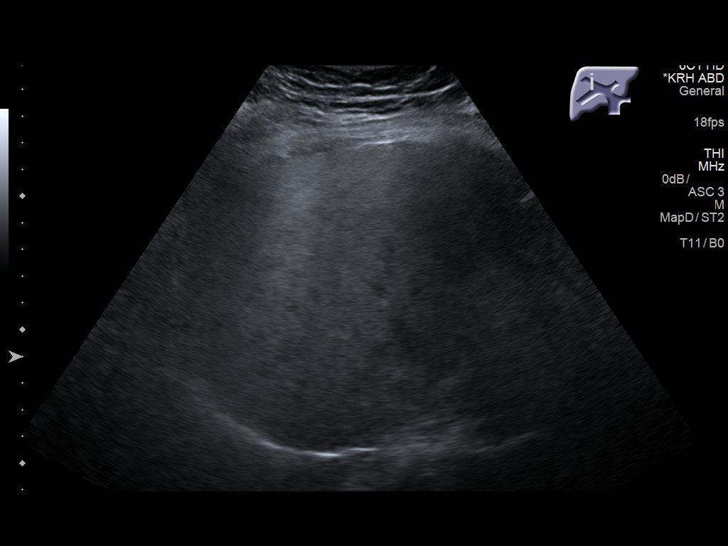
[im 27/33]
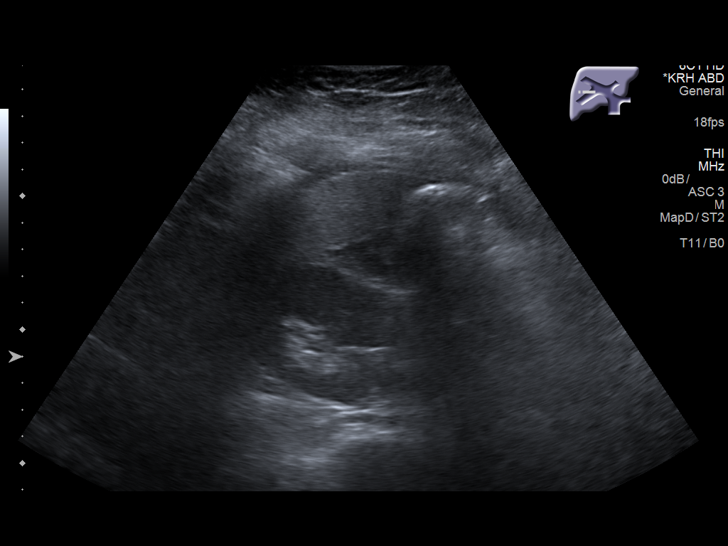
[im 30/33]
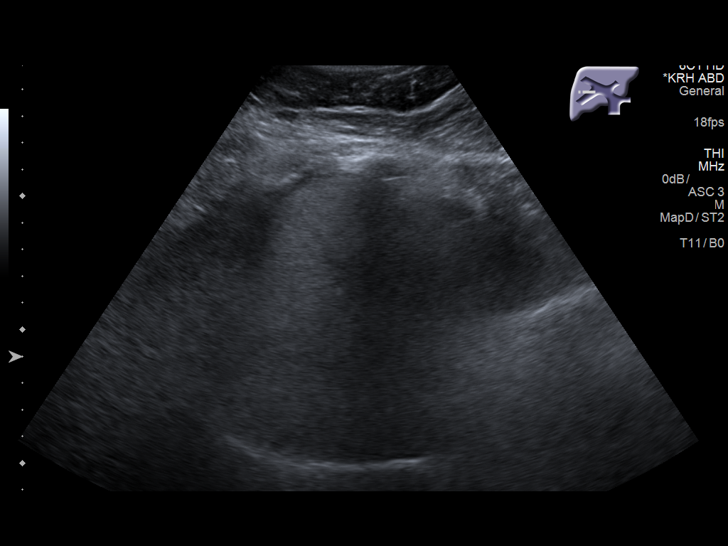
[im 33/33]
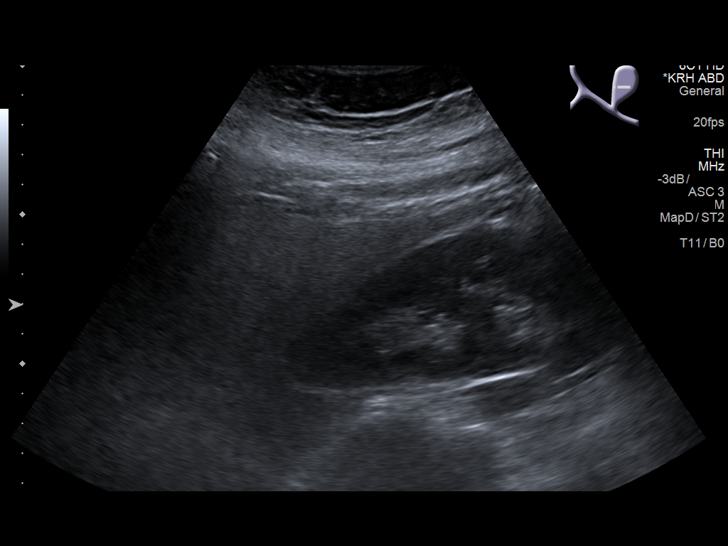

[14 of 25 positions shown; findings below may reference images not displayed]

FINDINGS: Gallbladder:

No gallstones or wall thickening visualized. No sonographic Murphy
sign noted by sonographer.

Common bile duct:

Diameter: 3 mm

Liver:

No focal lesion identified. Diffusely increased hepatic
echogenicity, suggestive of hepatic steatosis. Normal hepatopetal
flow in the portal vein.
IMPRESSION: 1. Diffusely increased hepatic echogenicity, suggestive of probable
hepatic steatosis.
2. No evidence of cholelithiasis or findings to suggest an acute
cholecystitis.

## 2017-07-31 IMAGING — US US PELVIS COMPLETE
1 series · 14 of 25 positions shown · non-contrast
Comparison: None

CLINICAL DATA: Vaginal bleeding for 2 months with low hemoglobin.

EXAM:
TRANSABDOMINAL AND TRANSVAGINAL ULTRASOUND OF PELVIS
TECHNIQUE: Both transabdominal and transvaginal ultrasound examinations of the
pelvis were performed. Transabdominal technique was performed for
global imaging of the pelvis including uterus, ovaries, adnexal
regions, and pelvic cul-de-sac. It was necessary to proceed with
endovaginal exam following the transabdominal exam to visualize the
uterus, ovaries, and adnexa.

[Series 1: us pelvis complete · 0.21mm/px · 14 of 63 slices shown]
[im 1/63]
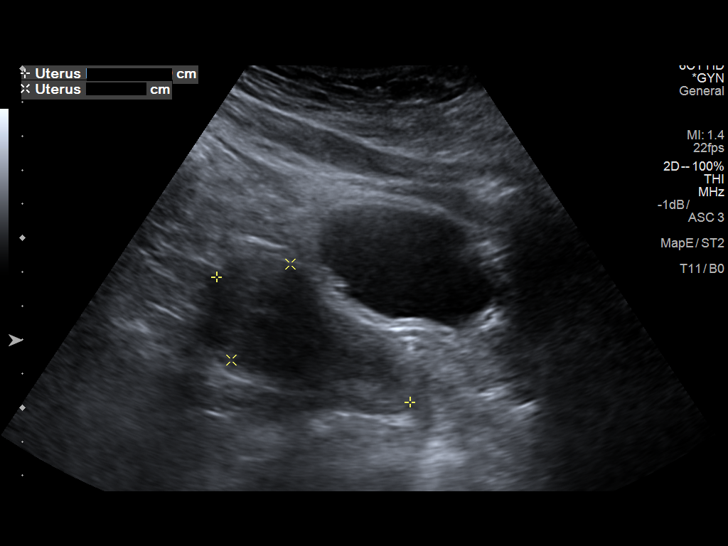
[im 6/63]
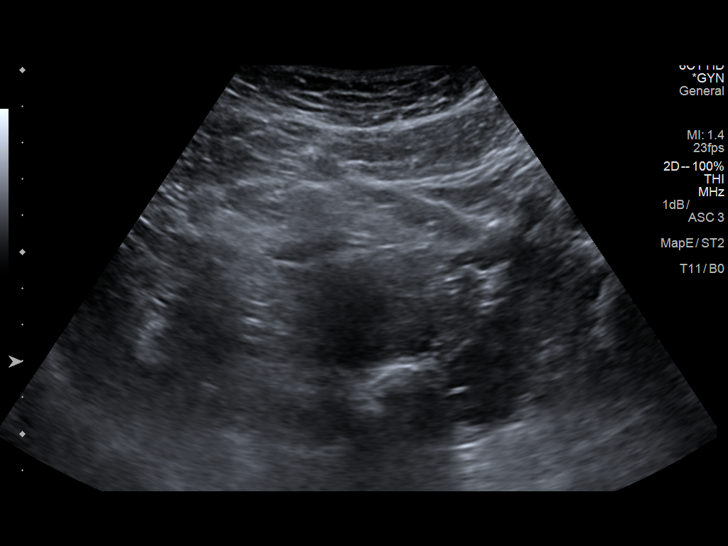
[im 11/63]
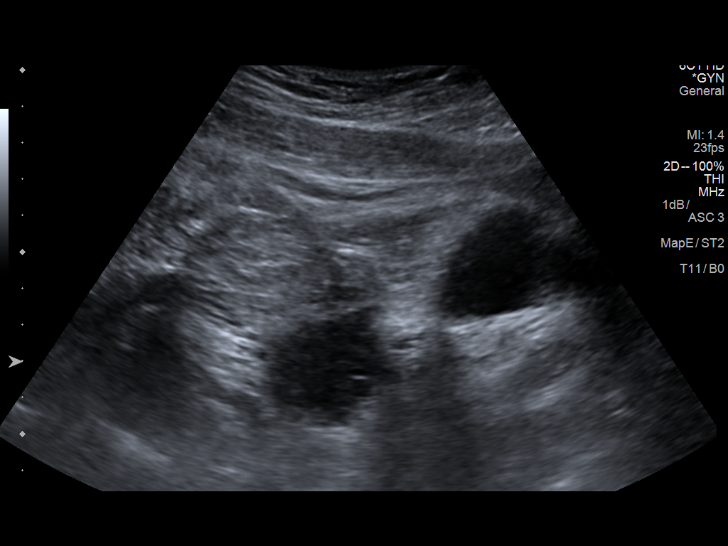
[im 16/63]
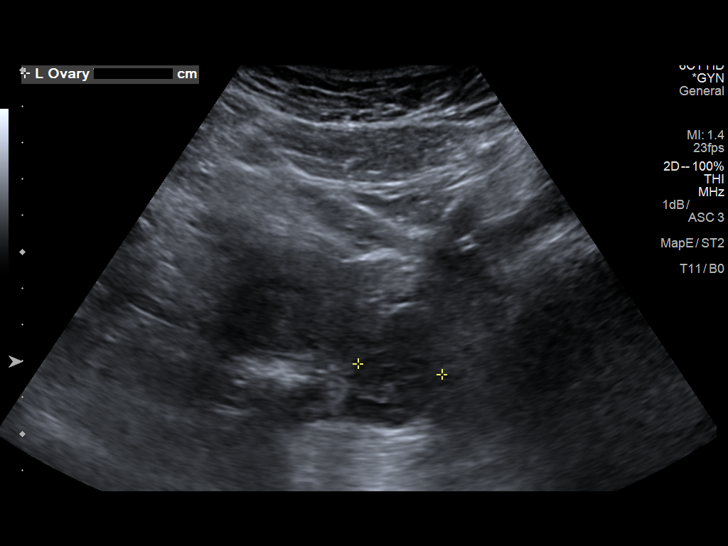
[im 21/63]
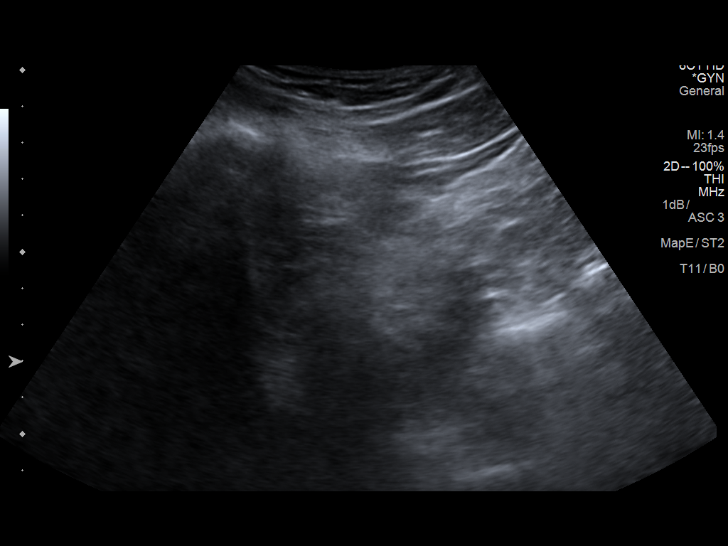
[im 24/63]
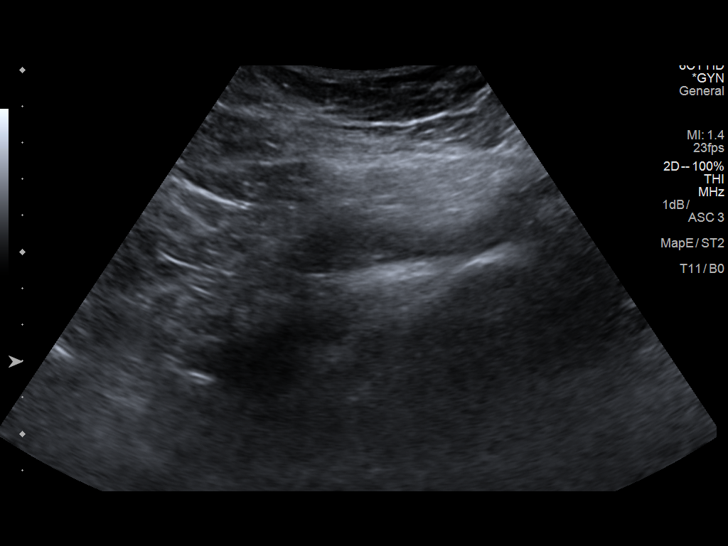
[im 29/63]
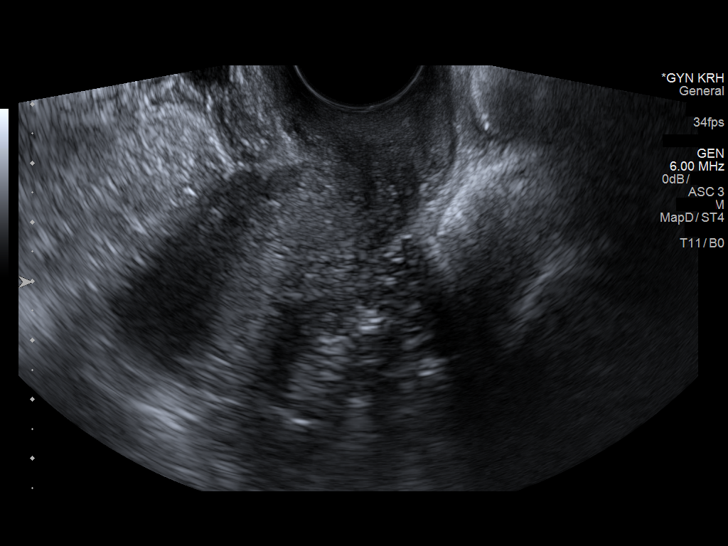
[im 34/63]
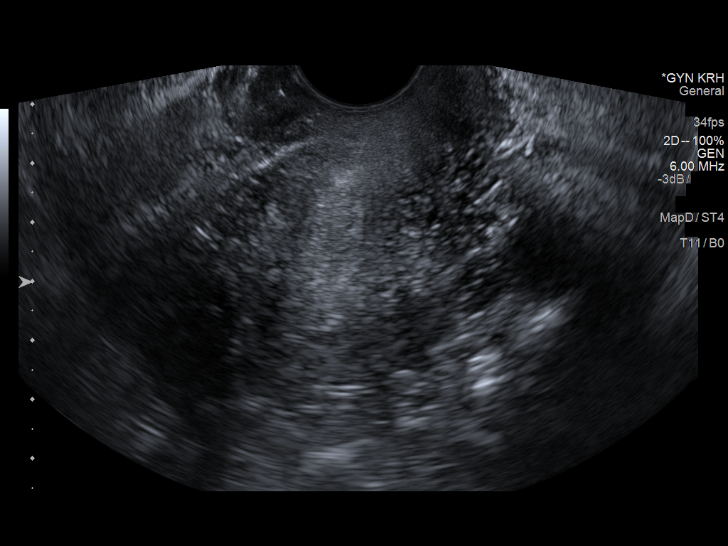
[im 39/63]
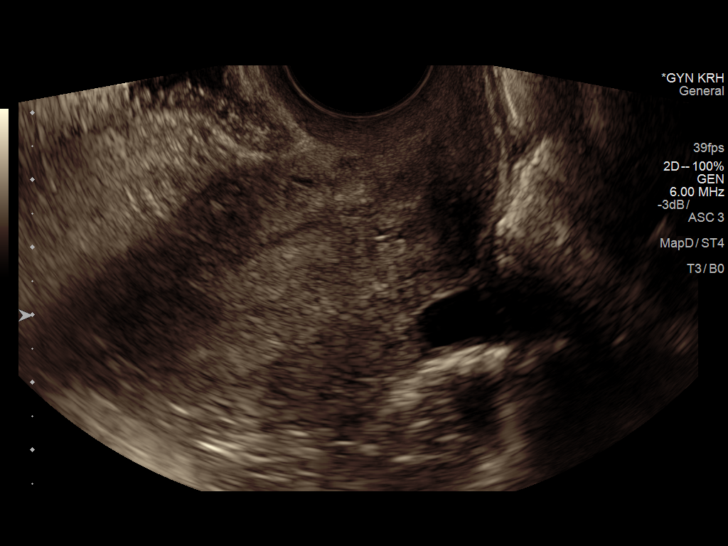
[im 42/63]
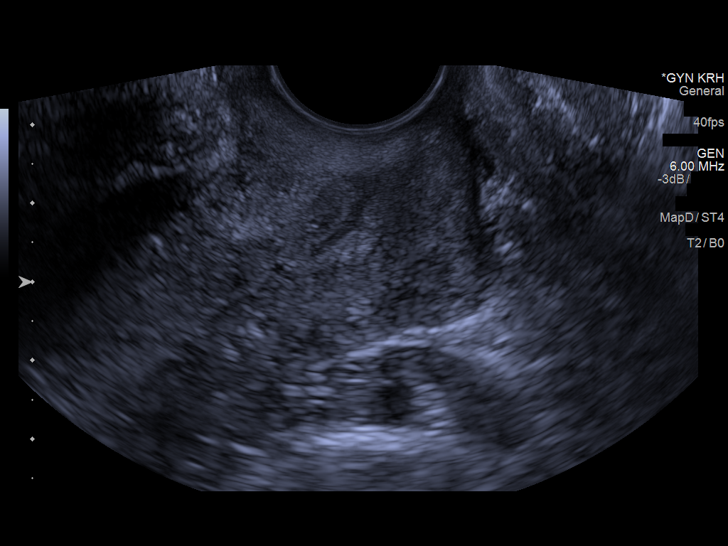
[im 47/63]
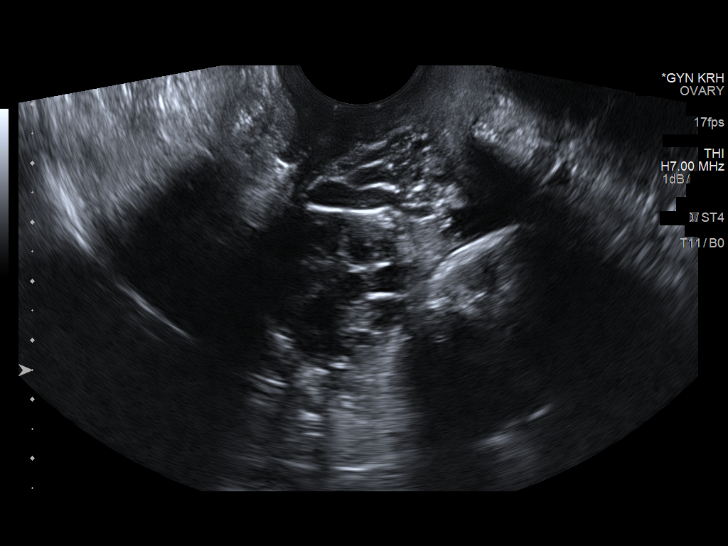
[im 52/63]
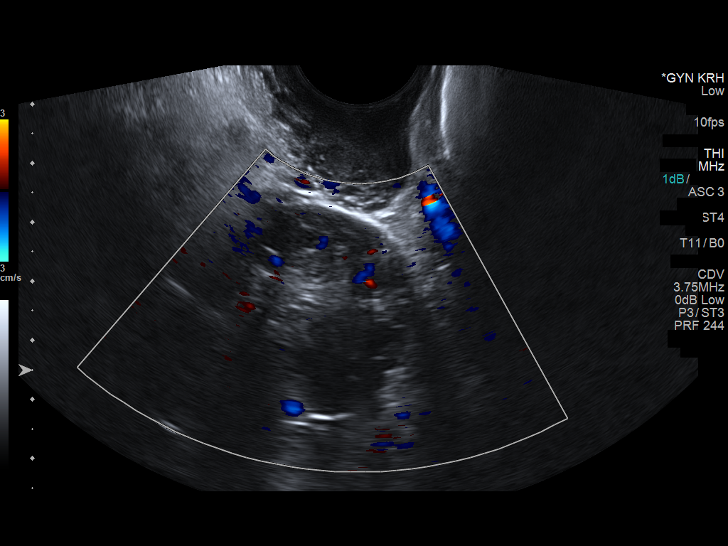
[im 57/63]
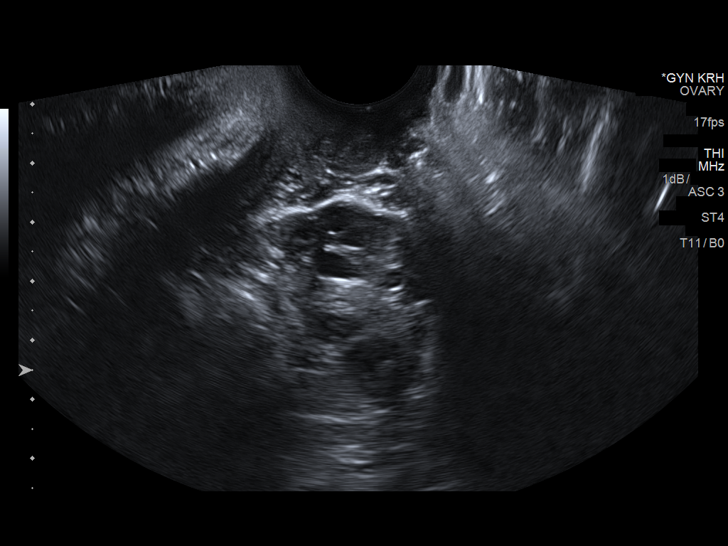
[im 63/63]
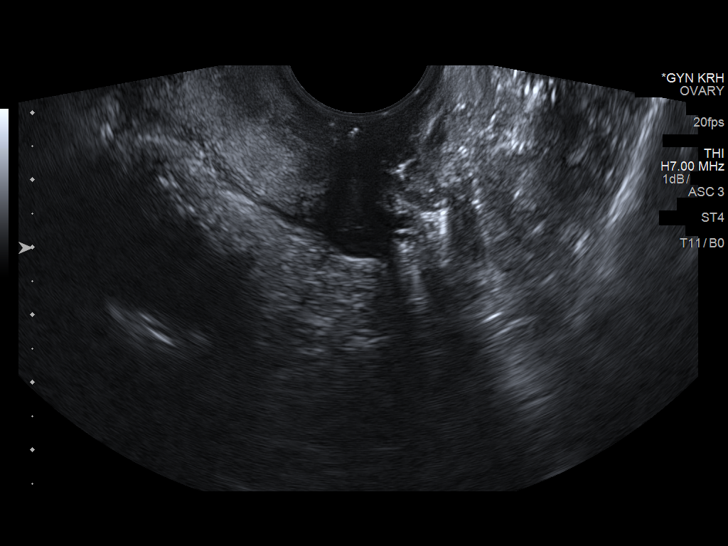

[14 of 25 positions shown; findings below may reference images not displayed]

FINDINGS: Uterus

Measurements: 6.4 x 3.4 x 4.4 cm. No fibroids or other mass
visualized.

Endometrium

Thickness: Normal, 11 mm..  No focal abnormality visualized.

Right ovary

Measurements: 3.9 x 2.9 by 2.4 cm. (volume = 14.1 cc). Multiple
small peripheral follicles identified.

Left ovary

Measurements: 3.6 x 3.5 x 2.8 cm (volume = 18.3 cc). Multiple small
peripheral follicles identified..

Other findings

Trace free pelvic fluid is likely physiologic.
IMPRESSION: 1. No evidence of endometrial thickening to explain excessive
vaginal bleeding.
2. Findings which are highly suspicious for polycystic ovarian
syndrome.

## 2018-02-02 ENCOUNTER — Emergency Department (HOSPITAL_COMMUNITY)
Admission: EM | Admit: 2018-02-02 | Discharge: 2018-02-02 | Discharge status: Against Medical Advice | Payer: Self-pay | Attending: Emergency Medicine | Admitting: Emergency Medicine

## 2018-02-02 ENCOUNTER — Other Ambulatory Visit: Payer: Self-pay

## 2018-02-02 ENCOUNTER — Encounter (HOSPITAL_COMMUNITY): Payer: Self-pay | Admitting: Emergency Medicine

## 2018-02-02 DIAGNOSIS — R109 Unspecified abdominal pain: Secondary | ICD-10-CM | POA: Insufficient documentation

## 2018-02-02 DIAGNOSIS — Z5321 Procedure and treatment not carried out due to patient leaving prior to being seen by health care provider: Secondary | ICD-10-CM | POA: Insufficient documentation

## 2018-02-02 LAB — COMPREHENSIVE METABOLIC PANEL
ALK PHOS: 55 U/L (ref 38–126)
ALT: 12 U/L (ref 0–44)
AST: 15 U/L (ref 15–41)
Albumin: 3.5 g/dL (ref 3.5–5.0)
Anion gap: 6 (ref 5–15)
BUN: 7 mg/dL (ref 6–20)
CALCIUM: 9.2 mg/dL (ref 8.9–10.3)
CO2: 27 mmol/L (ref 22–32)
CREATININE: 0.92 mg/dL (ref 0.44–1.00)
Chloride: 107 mmol/L (ref 98–111)
Glucose, Bld: 106 mg/dL — ABNORMAL HIGH (ref 70–99)
Potassium: 3.5 mmol/L (ref 3.5–5.1)
Sodium: 140 mmol/L (ref 135–145)
Total Bilirubin: 0.6 mg/dL (ref 0.3–1.2)
Total Protein: 6.8 g/dL (ref 6.5–8.1)

## 2018-02-02 LAB — I-STAT BETA HCG BLOOD, ED (MC, WL, AP ONLY)

## 2018-02-02 LAB — CBC
HCT: 38.2 % (ref 36.0–46.0)
Hemoglobin: 11.9 g/dL — ABNORMAL LOW (ref 12.0–15.0)
MCH: 25.6 pg — AB (ref 26.0–34.0)
MCHC: 31.2 g/dL (ref 30.0–36.0)
MCV: 82.2 fL (ref 78.0–100.0)
PLATELETS: 358 10*3/uL (ref 150–400)
RBC: 4.65 MIL/uL (ref 3.87–5.11)
RDW: 17.2 % — AB (ref 11.5–15.5)
WBC: 6.2 10*3/uL (ref 4.0–10.5)

## 2018-02-02 LAB — LIPASE, BLOOD: Lipase: 30 U/L (ref 11–51)

## 2018-02-02 NOTE — ED Notes (Signed)
Called pt number on file, no answer. No answer for room X2.

## 2018-02-02 NOTE — ED Triage Notes (Signed)
Pt reports lower abd pain and nausea for the past few days, denies any urinary symptoms, no diarrhea.

## 2018-02-02 NOTE — ED Notes (Signed)
The pt walked outside to call her boss after triage and was not outside when we called her for her room X 1.

## 2018-06-08 ENCOUNTER — Emergency Department (HOSPITAL_BASED_OUTPATIENT_CLINIC_OR_DEPARTMENT_OTHER)
Admission: EM | Admit: 2018-06-08 | Discharge: 2018-06-08 | Disposition: A | Discharge status: Home / Self Care | Payer: Self-pay | Attending: Emergency Medicine | Admitting: Emergency Medicine

## 2018-06-08 ENCOUNTER — Other Ambulatory Visit: Payer: Self-pay

## 2018-06-08 ENCOUNTER — Encounter (HOSPITAL_BASED_OUTPATIENT_CLINIC_OR_DEPARTMENT_OTHER): Payer: Self-pay | Admitting: Emergency Medicine

## 2018-06-08 DIAGNOSIS — R5383 Other fatigue: Secondary | ICD-10-CM | POA: Insufficient documentation

## 2018-06-08 DIAGNOSIS — Z5321 Procedure and treatment not carried out due to patient leaving prior to being seen by health care provider: Secondary | ICD-10-CM | POA: Insufficient documentation

## 2018-06-08 NOTE — ED Triage Notes (Signed)
Pt c/o flu like sx for the past three days, has known positive flu contact. Fatigue, congestion, sore throat. No OTC medications.

## 2018-09-05 ENCOUNTER — Ambulatory Visit
Admission: EM | Admit: 2018-09-05 | Discharge: 2018-09-05 | Disposition: A | Discharge status: Home / Self Care | Payer: Self-pay | Attending: Physician Assistant | Admitting: Physician Assistant

## 2018-09-05 DIAGNOSIS — J301 Allergic rhinitis due to pollen: Secondary | ICD-10-CM

## 2018-09-05 MED ORDER — FLUTICASONE PROPIONATE 50 MCG/ACT NA SUSP: 2.0000 | Freq: Every day | NASAL | 0 refills | Status: DC

## 2018-09-05 MED ORDER — CETIRIZINE HCL 10 MG PO TABS: 10.0000 mg | ORAL_TABLET | Freq: Every day | ORAL | 0 refills | Status: DC

## 2018-09-05 NOTE — ED Triage Notes (Signed)
Pt c/o cough and congestion x1wk

## 2018-09-05 NOTE — ED Provider Notes (Signed)
[name]    CSN: [id number] Arrival date & time: 09/05/18  1122     History   Chief Complaint Chief Complaint  Patient presents with  . Cough    HPI Rachel Russo is a 28 y.o. female.   28 year old female comes in for 1 week history of allergy symptoms.  Has had rhinorrhea, nasal congestion, cough, sneezing.  States symptoms worse every time she goes outdoors, and has improved since rain few days ago.  However, continues to be symptomatic, and came in for evaluation.  She denies fever, chills, night sweats.  Denies chest pain, shortness of breath, wheezing.  She has not taken any allergy medications.  Tried Mucinex without relief.  No sick contact. Traveled out of state 2 months ago.      History reviewed. No pertinent past medical history.  There are no active problems to display for this patient.   History reviewed. No pertinent surgical history.  OB History    Gravida  1   Para      Term      Preterm      AB      Living        SAB      TAB      Ectopic      Multiple      Live Births               Home Medications    Prior to Admission medications   Medication Sig Start Date End Date Taking? Authorizing Provider  cetirizine (ZYRTEC) 10 MG tablet Take 1 tablet (10 mg total) by mouth daily. 09/05/18   Cathie Hoops, Lona Six V, PA-C  fluticasone (FLONASE) 50 MCG/ACT nasal spray Place 2 sprays into both nostrils daily. 09/05/18   Belinda Fisher, PA-C    Family History No family history on file.  Social History Social History   Tobacco Use  . Smoking status: Never Smoker  . Smokeless tobacco: Never Used  Substance Use Topics  . Alcohol use: No  . Drug use: No     Allergies   Patient has no known allergies.   Review of Systems Review of Systems  Reason unable to perform ROS: See HPI as above.     Physical Exam Triage Vital Signs ED Triage Vitals [09/05/18 1130]  Enc Vitals Group     BP 131/89     Pulse Rate 71     Resp 18   Temp 98 F (36.7 C)     Temp Source Oral     SpO2 98 %     Weight      Height      Head Circumference      Peak Flow      Pain Score 0     Pain Loc      Pain Edu?      Excl. in GC?    No data found.  Updated Vital Signs BP 131/89 (BP Location: Left Arm)   Pulse 71   Temp 98 F (36.7 C) (Oral)   Resp 18   LMP 08/31/2018   SpO2 98%   Physical Exam Constitutional:      General: She is not in acute distress.    Appearance: She is well-developed. She is not ill-appearing, toxic-appearing or diaphoretic.  HENT:     Head: Normocephalic and atraumatic.     Right Ear: Tympanic membrane, ear canal and external ear normal. Tympanic membrane is not erythematous or bulging.  Left Ear: Tympanic membrane, ear canal and external ear normal. Tympanic membrane is not erythematous or bulging.     Nose: Congestion and rhinorrhea present.     Right Turbinates: Swollen.     Left Turbinates: Swollen.     Right Sinus: No maxillary sinus tenderness or frontal sinus tenderness.     Left Sinus: No maxillary sinus tenderness or frontal sinus tenderness.     Mouth/Throat:     Mouth: Mucous membranes are moist.     Pharynx: Oropharynx is clear. Uvula midline.  Eyes:     Conjunctiva/sclera: Conjunctivae normal.     Pupils: Pupils are equal, round, and reactive to light.  Neck:     Musculoskeletal: Normal range of motion and neck supple.  Cardiovascular:     Rate and Rhythm: Normal rate and regular rhythm.     Heart sounds: Normal heart sounds. No murmur. No friction rub. No gallop.   Pulmonary:     Effort: Pulmonary effort is normal. No accessory muscle usage, prolonged expiration, respiratory distress or retractions.     Breath sounds: Normal breath sounds. No stridor, decreased air movement or transmitted upper airway sounds. No decreased breath sounds, wheezing, rhonchi or rales.  Skin:    General: Skin is warm and dry.  Neurological:     Mental Status: She is alert and oriented to  person, place, and time.      UC Treatments / Results  Labs (all labs ordered are listed, but only abnormal results are displayed) Labs Reviewed - No data to display  EKG None  Radiology No results found.  Procedures Procedures (including critical care time)  Medications Ordered in UC Medications - No data to display  Initial Impression / Assessment and Plan / UC Course  I have reviewed the triage vital signs and the nursing notes.  Pertinent labs & imaging results that were available during my care of the patient were reviewed by me and considered in my medical decision making (see chart for details).    Discussed with patient history and exam are consistent with allergic rhinitis versus viral illness.  Will provide allergy medications for symptomatic management.  Push fluids.  Discussed isolation criteria if developing fever, chills, shortness of breath.  Return precautions given.  Patient expresses understanding and agrees to plan.  Final Clinical Impressions(s) / UC Diagnoses   Final diagnoses:  Seasonal allergic rhinitis due to pollen    ED Prescriptions    Medication Sig Dispense Auth. Provider   cetirizine (ZYRTEC) 10 MG tablet Take 1 tablet (10 mg total) by mouth daily. 30 tablet Ikesha Siller V, PA-C   fluticasone (FLONASE) 50 MCG/ACT nasal spray Place 2 sprays into both nostrils daily. 1 g Threasa Alpha, [place] 09/05/18 1307

## 2018-09-05 NOTE — Discharge Instructions (Signed)
Start zyrtec and flonase as directed. Keep hydrated, urine should be clear to pale yellow in color. Follow up with PCP if need further control for allergy symptoms. If develop cold symptoms such as body aches, chills, fever, shortness of breath, self isolate for 7 days AND greater than 72 hours without fever and cough. If worsening shortness of breath and unable to breath, call 911 and inform them of the situation.

## 2020-05-18 ENCOUNTER — Emergency Department (HOSPITAL_BASED_OUTPATIENT_CLINIC_OR_DEPARTMENT_OTHER): Payer: HRSA Program

## 2020-05-18 ENCOUNTER — Other Ambulatory Visit: Payer: Self-pay

## 2020-05-18 ENCOUNTER — Emergency Department (HOSPITAL_BASED_OUTPATIENT_CLINIC_OR_DEPARTMENT_OTHER)
Admission: EM | Admit: 2020-05-18 | Discharge: 2020-05-18 | Disposition: A | Discharge status: Home / Self Care | Payer: HRSA Program | Attending: Emergency Medicine | Admitting: Emergency Medicine

## 2020-05-18 ENCOUNTER — Encounter (HOSPITAL_BASED_OUTPATIENT_CLINIC_OR_DEPARTMENT_OTHER): Payer: Self-pay

## 2020-05-18 ENCOUNTER — Other Ambulatory Visit (HOSPITAL_BASED_OUTPATIENT_CLINIC_OR_DEPARTMENT_OTHER): Payer: Self-pay | Admitting: Physician Assistant

## 2020-05-18 DIAGNOSIS — U071 COVID-19: Secondary | ICD-10-CM | POA: Diagnosis not present

## 2020-05-18 DIAGNOSIS — J1282 Pneumonia due to coronavirus disease 2019: Secondary | ICD-10-CM | POA: Diagnosis not present

## 2020-05-18 DIAGNOSIS — J189 Pneumonia, unspecified organism: Secondary | ICD-10-CM

## 2020-05-18 DIAGNOSIS — R059 Cough, unspecified: Secondary | ICD-10-CM | POA: Diagnosis present

## 2020-05-18 LAB — BASIC METABOLIC PANEL
Anion gap: 6 (ref 5–15)
BUN: 9 mg/dL (ref 6–20)
CO2: 28 mmol/L (ref 22–32)
Calcium: 9 mg/dL (ref 8.9–10.3)
Chloride: 107 mmol/L (ref 98–111)
Creatinine, Ser: 1.04 mg/dL — ABNORMAL HIGH (ref 0.44–1.00)
GFR, Estimated: >60 mL/min (ref >60)
Glucose, Bld: 98 mg/dL (ref 70–99)
Potassium: 3.6 mmol/L (ref 3.5–5.1)
Sodium: 141 mmol/L (ref 135–145)

## 2020-05-18 LAB — RESP PANEL BY RT-PCR (FLU A&B, COVID) ARPGX2
Influenza A by PCR: NEGATIVE
Influenza B by PCR: NEGATIVE
SARS Coronavirus 2 by RT PCR: POSITIVE — AB

## 2020-05-18 LAB — CBC
HCT: 41.3 % (ref 36.0–46.0)
Hemoglobin: 13.4 g/dL (ref 12.0–15.0)
MCH: 27.6 pg (ref 26.0–34.0)
MCHC: 32.4 g/dL (ref 30.0–36.0)
MCV: 85.2 fL (ref 80.0–100.0)
Platelets: 331 10*3/uL (ref 150–400)
RBC: 4.85 MIL/uL (ref 3.87–5.11)
RDW: 13.5 % (ref 11.5–15.5)
WBC: 5.9 10*3/uL (ref 4.0–10.5)
nRBC: 0 % (ref 0.0–0.2)

## 2020-05-18 LAB — TROPONIN I (HIGH SENSITIVITY): Troponin I (High Sensitivity): <2 ng/L (ref <18)

## 2020-05-18 MED ORDER — DOXYCYCLINE HYCLATE 100 MG PO CAPS: 100.0000 mg | ORAL_CAPSULE | Freq: Two times a day (BID) | ORAL | 0 refills | Status: DC

## 2020-05-18 MED ORDER — ONDANSETRON 4 MG PO TBDP
4.0000 mg | ORAL_TABLET | Freq: Once | ORAL | Status: DC
  Filled 2020-05-18: qty 1

## 2020-05-18 MED FILL — DOXYCYCLINE HYCLATE 100 MG: 100 | 5 days supply | Qty: 10 | Fill #0

## 2020-05-18 NOTE — Discharge Instructions (Addendum)
We discussed your symptoms are likely being caused due to the COVID-19 infection.  You will need to quarantine for the next 14 days.  I have prescribed a short course of antibiotics, we discussed the benefits and risk of taking this medication at this time.  You may treat your symptoms with Tylenol, Robitussin, plenty of fluids at home.

## 2020-05-18 NOTE — ED Provider Notes (Signed)
[facility] EMERGENCY DEPARTMENT Provider Note   CSN: [id number] Arrival date & time: 05/18/20  0944     History Chief Complaint  Patient presents with  . Cough    Rachel Russo is a 29 y.o. female.  29 y.o female with no PMH presents to the ED with a chief complaint of cough for the past 3 days.  This is dry in nature, has tried over-the-counter medication without improvement in her symptoms.  She also reports placing potatoes on her socks to help with cough without improvement. Has been taking Robitussin at night without improvement.  Does endorse pain along her chest that occurs when she coughs.  She noted a headache Saturday, states that she thought she "I might have been hung over ", took some Tylenol with improvement.  Has been trying to hydrate with water and Gatorade.  Denies any fever, body aches, sore throat, or history of heart disease.  No prior COVID-19 immunizations.  The history is provided by the patient.  Cough Cough characteristics:  Dry Sputum characteristics:  Nondescript Severity:  Moderate Onset quality:  Gradual Duration:  3 days Timing:  Intermittent Progression:  Unchanged Chronicity:  New Smoker: no   Context: not sick contacts, not smoke exposure and not weather changes   Associated symptoms: no chest pain, no diaphoresis, no fever, no headaches and no myalgias        History reviewed. No pertinent past medical history.  There are no problems to display for this patient.   History reviewed. No pertinent surgical history.   OB History    Gravida  1   Para      Term      Preterm      AB      Living        SAB      IAB      Ectopic      Multiple      Live Births              History reviewed. No pertinent family history.  Social History   Tobacco Use  . Smoking status: Never Smoker  . Smokeless tobacco: Never Used  Substance Use Topics  . Alcohol use: Yes    Comment: occ  . Drug use: No    Home  Medications Prior to Admission medications   Medication Sig Start Date End Date Taking? Authorizing Provider  cetirizine (ZYRTEC) 10 MG tablet Take 1 tablet (10 mg total) by mouth daily. 09/05/18   Cathie Hoops, Amy V, PA-C  doxycycline (VIBRAMYCIN) 100 MG capsule Take 1 capsule (100 mg total) by mouth 2 (two) times daily for 5 days. 05/18/20 05/23/20  Claude Manges, PA-C  fluticasone (FLONASE) 50 MCG/ACT nasal spray Place 2 sprays into both nostrils daily. 09/05/18   Belinda Fisher, PA-C    Allergies    Patient has no known allergies.  Review of Systems   Review of Systems  Constitutional: Negative for diaphoresis and fever.  Respiratory: Positive for cough.   Cardiovascular: Negative for chest pain, palpitations and leg swelling.  Gastrointestinal: Negative for abdominal pain, nausea and vomiting.  Genitourinary: Negative for flank pain.  Musculoskeletal: Negative for back pain and myalgias.  Neurological: Negative for headaches.  All other systems reviewed and are negative.   Physical Exam Updated Vital Signs BP 128/64 (BP Location: Right Arm)   Pulse 86   Temp 98.4 F (36.9 C) (Oral)   Resp 20   Ht 5\' 10"  (1.778 m)  Wt 124.7 kg   LMP 05/14/2020   SpO2 97%   BMI 39.45 kg/m   Physical Exam Vitals and nursing note reviewed.  Constitutional:      Appearance: Normal appearance. She is not ill-appearing.     Comments: Non ill, nontoxic appearing.  HENT:     Head: Normocephalic and atraumatic.     Mouth/Throat:     Mouth: Mucous membranes are moist.     Comments: Mild surrounding erythema.  No tonsillar abscess, no exudate, no PTA. Pulmonary:     Effort: Pulmonary effort is normal.     Breath sounds: Rhonchi present.  Abdominal:     General: Abdomen is flat.     Palpations: Abdomen is soft.     Tenderness: There is no abdominal tenderness.  Musculoskeletal:     Cervical back: Normal range of motion and neck supple.  Skin:    General: Skin is warm and dry.     Findings: Rash:    Neurological:     Mental Status: She is alert and oriented to person, place, and time.     ED Results / Procedures / Treatments   Labs (all labs ordered are listed, but only abnormal results are displayed) Labs Reviewed  RESP PANEL BY RT-PCR (FLU A&B, COVID) ARPGX2 - Abnormal; Notable for the following components:      Result Value   SARS Coronavirus 2 by RT PCR POSITIVE (*)    All other components within normal limits  BASIC METABOLIC PANEL - Abnormal; Notable for the following components:   Creatinine, Ser 1.04 (*)    All other components within normal limits  CBC  PREGNANCY, URINE  TROPONIN I (HIGH SENSITIVITY)    EKG None  Radiology DG Chest 2 View  Result Date: 05/18/2020 CLINICAL DATA:  Cough and chest discomfort. EXAM: CHEST - 2 VIEW COMPARISON:  None. FINDINGS: The cardiomediastinal silhouette is within normal limits. The lungs are mildly hypoinflated, and there are mildly increased lung markings in the bases. No airspace consolidation, overt pulmonary edema, pleural effusion, pneumothorax is identified. No acute osseous abnormality is seen. IMPRESSION: Mildly low lung volumes with mild bibasilar opacities which may reflect atelectasis or early infection. Electronically Signed   By: Sebastian Ache M.D.   On: 05/18/2020 10:13    Procedures Procedures (including critical care time)  Medications Ordered in ED Medications  ondansetron (ZOFRAN-ODT) disintegrating tablet 4 mg (4 mg Oral Not Given 05/18/20 1341)    ED Course  I have reviewed the triage vital signs and the nursing notes.  Pertinent labs & imaging results that were available during my care of the patient were reviewed by me and considered in my medical decision making (see chart for details).    MDM Rules/Calculators/A&P     Patient with no past medical history presents to the ED with a complaint of cough for the past 3 days.  Has been taking over-the-counter medication without improvement in her  symptoms.  Vitals are stable on today's visit, she is satting at 98%, she has been afebrile.  No prior COVID-19 immunizations.  Does endorse chest pain that occurs when she coughs, no prior cardiac history, have a low suspicion for ACS at this time.  Does state he felt somewhat hung over on Saturday, took some Tylenol with some improvement in her symptoms.  No smoking history.  No body aches.  Lungs are slightly diminished to auscultation of bilateral lower bases.  No chest tenderness with palpation, abdomen is soft, nontender to  palpation, has not had any nausea or vomiting.  Patient tested for COVID-19 while in the ED, pending results.  No bilateral pitting edema, or swelling to her legs, prior history of CHF.  He does suffer from allergies. Currently on Zyrtec's daily.  Chest x-ray did show some early signs of infection.  Interpretation of her labs with a CBC without any leukocytosis, no signs of anemia.  BMP without any electrolyte derangement, creatinine slightly elevated but within normal range.  Troponin is negative.   COVID-19 test is positive at this time.  X-ray does have early findings consistent with pneumonia, unsure whether this is lobar at this time.  Patient's son at the bedside had Covid previously.  We discussed treating pneumonia with antibiotics, she is aware that this will likely not work unless this is more so bacterial in nature.  She is provided with a prescription for doxycycline which she will fill only if symptoms do not improve or supportive measurements.  Will also be provided with a note for the next 14 days.  Patient is aware that she will need to quarantine and isolate from others.  Return precautions discussed at length.  Patient is stable for discharge.   Portions of this note were generated with Scientist, clinical (histocompatibility and immunogenetics). Dictation errors may occur despite best attempts at proofreading.  Final Clinical Impression(s) / ED Diagnoses Final diagnoses:  Cough  Lab test  positive for detection of COVID-19 virus  Community acquired pneumonia, unspecified laterality    Rx / DC Orders ED Discharge Orders         Ordered    doxycycline (VIBRAMYCIN) 100 MG capsule  2 times daily        05/18/20 1414           Claude Manges, PA-C 05/18/20 1416    Alvira Monday, MD 05/19/20 1114

## 2020-05-18 NOTE — ED Notes (Signed)
Covid swab was attempted numerous times. Pt was uncooperative and pulled the swab out before a quality specimen could be obtained. Pt is aware the quantity was insufficient but wants the test run.

## 2020-05-18 NOTE — ED Triage Notes (Signed)
Pt reports cough since Saturday. Pt states she has chest discomfort with her cough.

## 2020-05-19 ENCOUNTER — Telehealth (HOSPITAL_COMMUNITY): Payer: Self-pay

## 2020-05-19 NOTE — Telephone Encounter (Signed)
Called to Discuss with patient about Covid symptoms and the use of the monoclonal antibody infusion for those with mild to moderate Covid symptoms and at a high risk of hospitalization.     Pt appears to qualify for this infusion due to co-morbid conditions and/or a member of an at-risk group in accordance with the FDA Emergency Use Authorization.    Unable to reach pt    

## 2020-05-27 ENCOUNTER — Ambulatory Visit
Admission: EM | Admit: 2020-05-27 | Discharge: 2020-05-27 | Disposition: A | Discharge status: Home / Self Care | Payer: Self-pay | Attending: Emergency Medicine | Admitting: Emergency Medicine

## 2020-05-27 ENCOUNTER — Other Ambulatory Visit: Payer: Self-pay

## 2020-05-27 ENCOUNTER — Encounter: Payer: Self-pay | Admitting: *Deleted

## 2020-05-27 DIAGNOSIS — M79602 Pain in left arm: Secondary | ICD-10-CM

## 2020-05-27 DIAGNOSIS — U099 Post covid-19 condition, unspecified: Secondary | ICD-10-CM

## 2020-05-27 DIAGNOSIS — R053 Chronic cough: Secondary | ICD-10-CM

## 2020-05-27 HISTORY — DX: Pneumonia due to coronavirus disease 2019: J12.82

## 2020-05-27 HISTORY — DX: COVID-19: U07.1

## 2020-05-27 MED ORDER — ACETAMINOPHEN 325 MG PO TABS
650.0000 mg | ORAL_TABLET | Freq: Once | ORAL | Status: AC
  Administered 2020-05-27: 14:00:00 650 mg via ORAL

## 2020-05-27 MED ORDER — BENZONATATE 100 MG PO CAPS: 100.0000 mg | ORAL_CAPSULE | Freq: Three times a day (TID) | ORAL | 0 refills | Status: AC

## 2020-05-27 MED ORDER — NAPROXEN 500 MG PO TABS: 500.0000 mg | ORAL_TABLET | Freq: Two times a day (BID) | ORAL | 0 refills | Status: AC

## 2020-05-27 MED ORDER — CYCLOBENZAPRINE HCL 5 MG PO TABS: 5.0000 mg | ORAL_TABLET | Freq: Two times a day (BID) | ORAL | 0 refills | Status: AC | PRN
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
## 2020-05-27 NOTE — Discharge Instructions (Signed)
RICE: rest, ice, compression, elevation as needed for pain.    Pain medication:  500 mg Naprosyn/Aleve (naproxen) every 12 hours with food:  AVOID other NSAIDs while taking this (may have Tylenol).  May take muscle relaxer as needed for severe pain / spasm.  (This medication may cause you to become tired so it is important you do not drink alcohol or operate heavy machinery while on this medication.  Recommend your first dose to be taken before bedtime to monitor for side effects safely)  Important to follow up with specialist(s) below for further evaluation/management if your symptoms persist or worsen. 

## 2021-03-12 IMAGING — CR DG CHEST 2V
2 series · 2 of 2 positions shown · non-contrast
Comparison: None.

CLINICAL DATA: Cough and chest discomfort.

EXAM:
CHEST - 2 VIEW

[w chest pa]
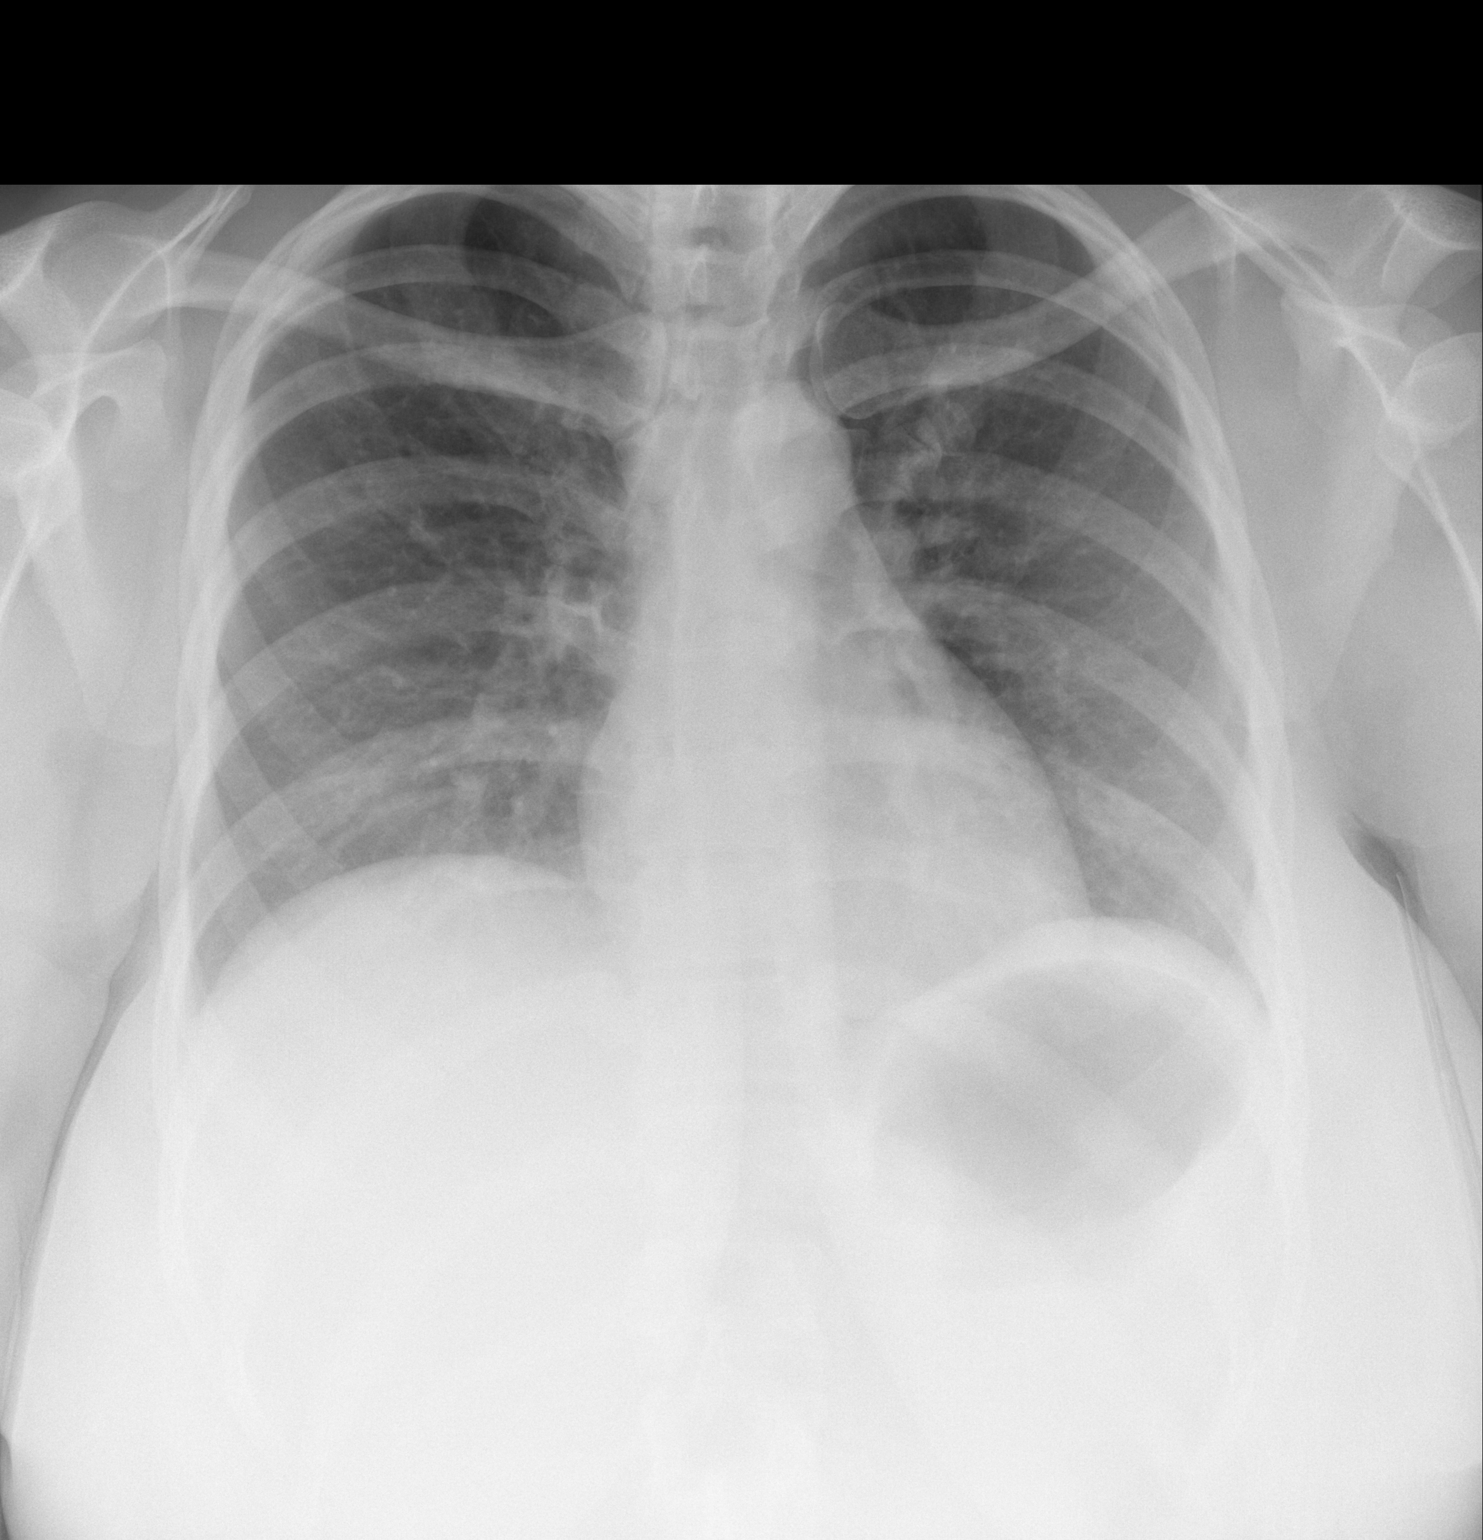

[w chest lat]
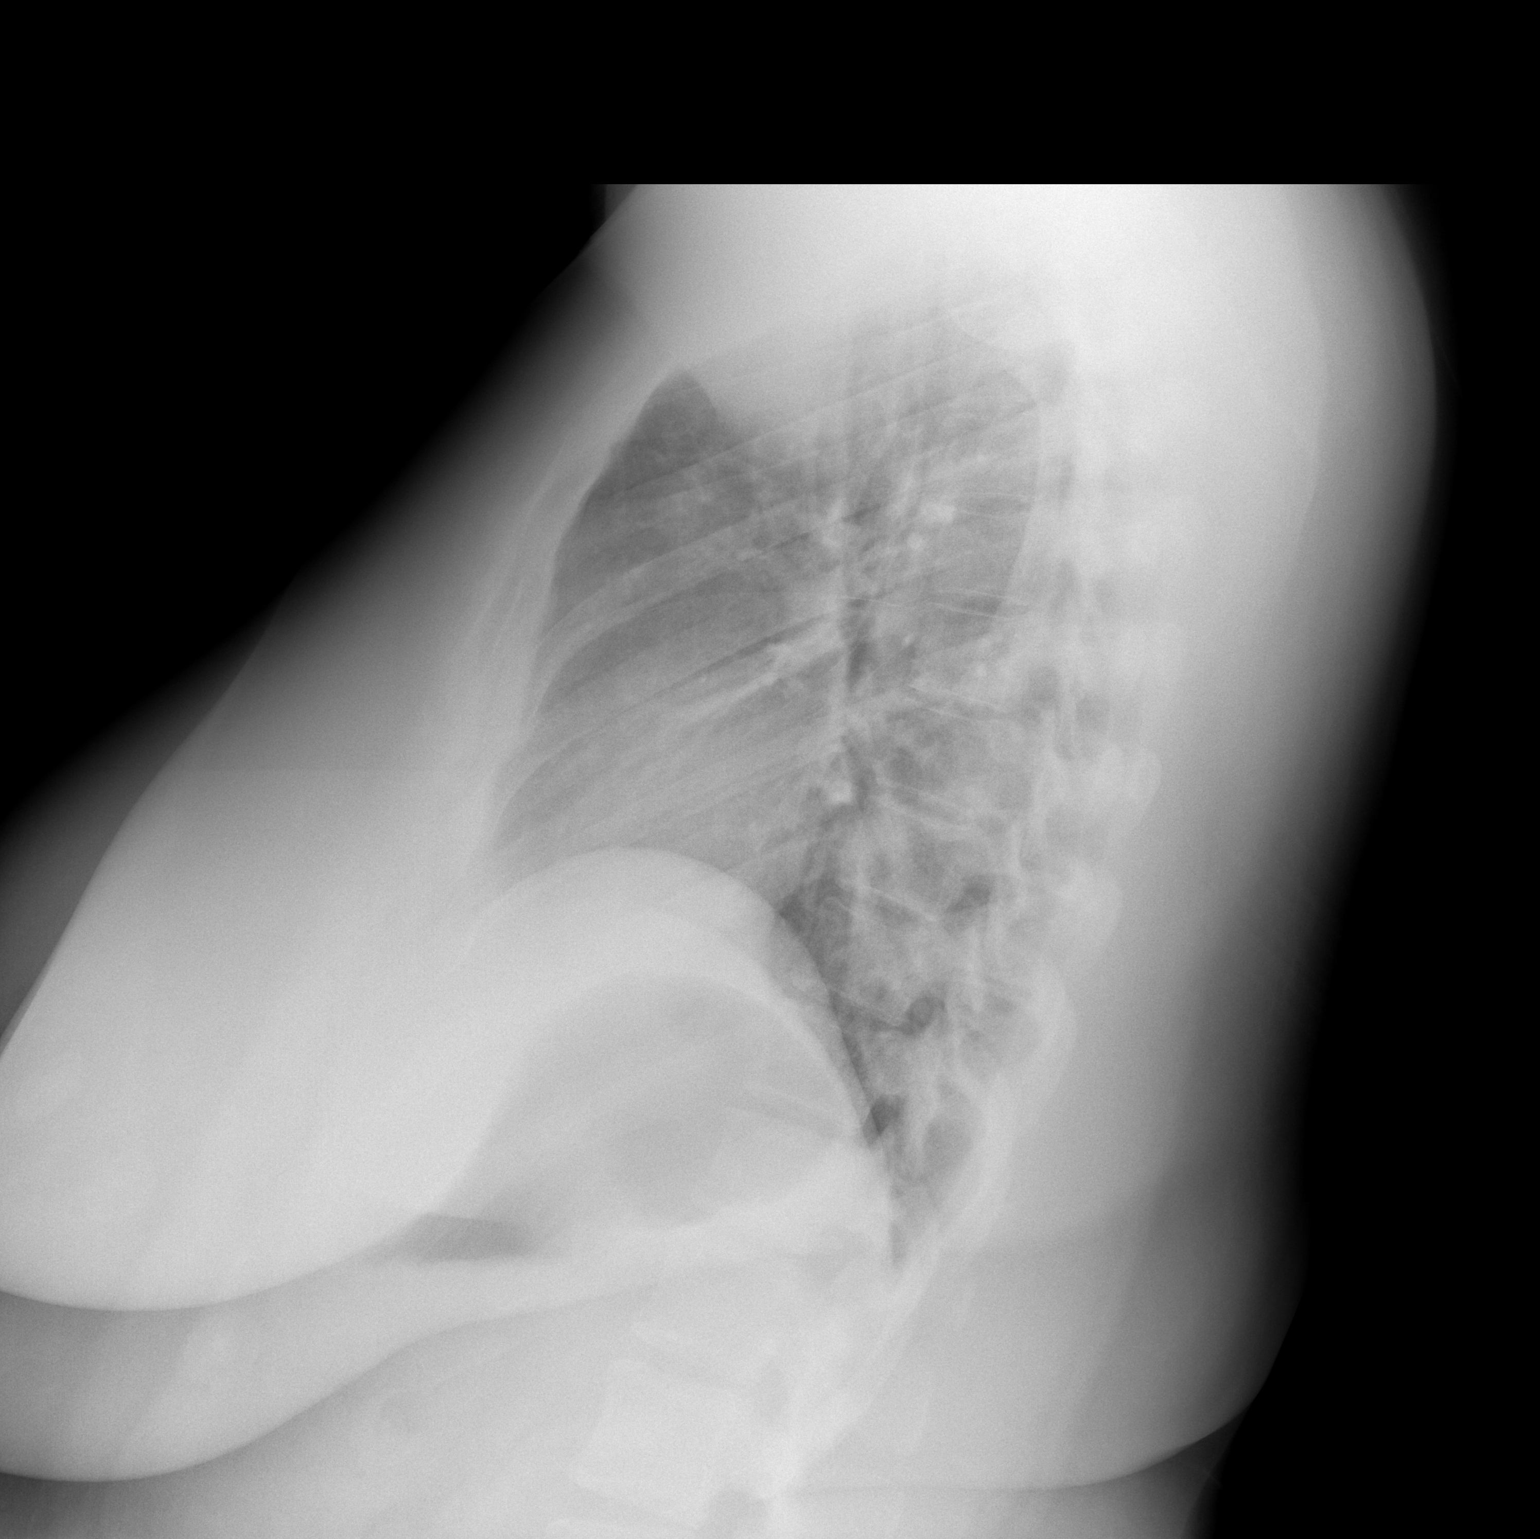

[2 of 2 positions shown; findings below may reference images not displayed]

FINDINGS: The cardiomediastinal silhouette is within normal limits. The lungs
are mildly hypoinflated, and there are mildly increased lung
markings in the bases. No airspace consolidation, overt pulmonary
edema, pleural effusion, pneumothorax is identified. No acute
osseous abnormality is seen.
IMPRESSION: Mildly low lung volumes with mild bibasilar opacities which may
reflect atelectasis or early infection.

## 2021-07-07 ENCOUNTER — Other Ambulatory Visit: Payer: Self-pay

## 2021-07-07 ENCOUNTER — Ambulatory Visit
Admission: EM | Admit: 2021-07-07 | Discharge: 2021-07-07 | Disposition: A | Discharge status: Home / Self Care | Payer: BLUE CROSS/BLUE SHIELD | Attending: Internal Medicine | Admitting: Internal Medicine

## 2021-07-07 ENCOUNTER — Encounter: Payer: Self-pay | Admitting: Emergency Medicine

## 2021-07-07 DIAGNOSIS — R0789 Other chest pain: Secondary | ICD-10-CM | POA: Diagnosis not present

## 2021-07-07 DIAGNOSIS — L02412 Cutaneous abscess of left axilla: Secondary | ICD-10-CM | POA: Diagnosis not present

## 2021-07-07 MED ORDER — DOXYCYCLINE HYCLATE 100 MG PO CAPS: 100.0000 mg | ORAL_CAPSULE | Freq: Two times a day (BID) | ORAL | 0 refills | Status: DC

## 2021-07-07 MED ORDER — FLUCONAZOLE 150 MG PO TABS: 150.0000 mg | ORAL_TABLET | Freq: Every day | ORAL | 0 refills | Status: DC

## 2021-07-07 NOTE — Discharge Instructions (Signed)
You have an abscess of your armpit which is being treated with doxycycline antibiotic.  Please continue warm compresses.  Diflucan has been prescribed in case antibiotic causes yeast infection.

## 2021-07-07 NOTE — ED Triage Notes (Addendum)
Chest pain starting one hour ago, substernal, left side, radiates through to back/middle of shoulders, last ate around 10 am this morning. Denies SOB, wheezing, cough. States she had covid 2 weeks ago. Believes the pain is coming from a boil under her left arm that appeared last sunday, but the site does not appear to have any related swelling/redness/warmth extending to where the chest pain is located. Patient is obese

## 2021-07-07 NOTE — ED Provider Notes (Signed)
[name]    CSN: [id number] Arrival date & time: 07/07/21  1700      History   Chief Complaint Chief Complaint  Patient presents with   Chest Pain   Skin Problem    HPI Rachel Russo is a 31 y.o. female.   Patient presents with chest pain that radiates from the left underarm across left breast and across left chest that started approximately 10 AM this morning.  Patient reports that she thinks it is related to abscess in left axilla that has been present for a few days.  She reports that pain is worsened with moving arm where abscess is present.  Denies any numbness or tingling.  Denies shortness of breath.  Denies headache, blurred vision, nausea, vomiting, dizziness.  Denies any drainage from the abscess.  Denies fevers, body aches, chills. Denies history of cardiac problems.    Chest Pain  Past Medical History:  Diagnosis Date   Pneumonia due to COVID-19 virus    05/15/2020 dx    There are no problems to display for this patient.   History reviewed. No pertinent surgical history.  OB History     Gravida  1   Para      Term      Preterm      AB      Living         SAB      IAB      Ectopic      Multiple      Live Births               Home Medications    Prior to Admission medications   Medication Sig Start Date End Date Taking? Authorizing Provider  doxycycline (VIBRAMYCIN) 100 MG capsule Take 1 capsule (100 mg total) by mouth 2 (two) times daily. 07/07/21  Yes Jamorris Ndiaye, Hildred Alamin E, FNP  fluconazole (DIFLUCAN) 150 MG tablet Take 1 tablet (150 mg total) by mouth daily. Take at first sign of vaginal yeast. 07/07/21  Yes Cullin Dishman, Michele Rockers, FNP  benzonatate (TESSALON) 100 MG capsule Take 1 capsule (100 mg total) by mouth every 8 (eight) hours. 05/27/20   Hall-Potvin, [place], PA-C  naproxen (NAPROSYN) 500 MG tablet Take 1 tablet (500 mg total) by mouth 2 (two) times daily. 05/27/20   Hall-Potvin, [place], PA-C  cetirizine (ZYRTEC) 10 MG  tablet Take 1 tablet (10 mg total) by mouth daily. 09/05/18 05/27/20  Tasia Catchings, Amy V, PA-C  fluticasone (FLONASE) 50 MCG/ACT nasal spray Place 2 sprays into both nostrils daily. 09/05/18 05/27/20  Ok Edwards, PA-C    Family History Family History  Problem Relation Age of Onset   Healthy Mother    Diabetes Father     Social History Social History   Tobacco Use   Smoking status: Never   Smokeless tobacco: Never  Vaping Use   Vaping Use: Never used  Substance Use Topics   Alcohol use: Yes    Comment: occasionally   Drug use: No     Allergies   Patient has no known allergies.   Review of Systems Review of Systems Per HPI  Physical Exam Triage Vital Signs ED Triage Vitals  Enc Vitals Group     BP 07/07/21 1720 115/79     Pulse Rate 07/07/21 1720 85     Resp 07/07/21 1720 16     Temp 07/07/21 1720 98.2 F (36.8 C)     Temp Source 07/07/21 1720 Oral  SpO2 07/07/21 1720 96 %     Weight --      Height --      Head Circumference --      Peak Flow --      Pain Score 07/07/21 1716 8     Pain Loc --      Pain Edu? --      Excl. in [place]? --    No data found.  Updated Vital Signs BP 115/79 (BP Location: Left Arm)    Pulse 85    Temp 98.2 F (36.8 C) (Oral)    Resp 16    SpO2 96%   Visual Acuity Right Eye Distance:   Left Eye Distance:   Bilateral Distance:    Right Eye Near:   Left Eye Near:    Bilateral Near:     Physical Exam Constitutional:      General: She is not in acute distress.    Appearance: Normal appearance. She is not toxic-appearing or diaphoretic.  HENT:     Head: Normocephalic and atraumatic.  Eyes:     Extraocular Movements: Extraocular movements intact.     Conjunctiva/sclera: Conjunctivae normal.  Cardiovascular:     Rate and Rhythm: Normal rate and regular rhythm.     Pulses: Normal pulses.     Heart sounds: Normal heart sounds.  Pulmonary:     Effort: Pulmonary effort is normal. No respiratory distress.     Breath sounds: Normal breath  sounds.  Chest:     Chest wall: No tenderness.     Comments: Range of motion of left arm causes chest pain across left chest. Skin:    General: Skin is warm and dry.     Findings: Abscess present.     Comments: Approximately 2.5 cm indurated abscess present to left lateral axilla.  No drainage noted.  Neurological:     General: No focal deficit present.     Mental Status: She is alert and oriented to person, place, and time. Mental status is at baseline.  Psychiatric:        Mood and Affect: Mood normal.        Behavior: Behavior normal.        Thought Content: Thought content normal.        Judgment: Judgment normal.     UC Treatments / Results  Labs (all labs ordered are listed, but only abnormal results are displayed) Labs Reviewed - No data to display  EKG   Radiology No results found.  Procedures Procedures (including critical care time)  Medications Ordered in UC Medications - No data to display  Initial Impression / Assessment and Plan / UC Course  I have reviewed the triage vital signs and the nursing notes.  Pertinent labs & imaging results that were available during my care of the patient were reviewed by me and considered in my medical decision making (see chart for details).     Patient has abscess to left axilla that is being treated with doxycycline antibiotic.  Patient requesting Diflucan as antibiotics typically give her yeast infection.  Will send 1 pill of Diflucan.  I&D not indicated at this time given appearance of abscess.  Patient to use warm compresses as well.  The patient's chest pain is related to abscess as movement of area where abscess is located causes chest pain.EKG was completed by nursing per protocol prior to provider exam.  This was normal sinus rhythm.  Do not suspect any underlying cardiac etiology given patient's  age and history.  Discussed strict return ER precautions.  Patient verbalized understanding and was agreeable with  plan. Final Clinical Impressions(s) / UC Diagnoses   Final diagnoses:  Abscess of left axilla  Other chest pain     Discharge Instructions      You have an abscess of your armpit which is being treated with doxycycline antibiotic.  Please continue warm compresses.  Diflucan has been prescribed in case antibiotic causes yeast infection.    ED Prescriptions     Medication Sig Dispense Auth. Provider   doxycycline (VIBRAMYCIN) 100 MG capsule Take 1 capsule (100 mg total) by mouth 2 (two) times daily. 20 capsule [place], [place] E, [place]   fluconazole (DIFLUCAN) 150 MG tablet Take 1 tablet (150 mg total) by mouth daily. Take at first sign of vaginal yeast. 1 tablet [place], Michele Rockers, [place]      PDMP not reviewed this encounter.   Teodora Medici, [place] 07/07/21 [phone]

## 2022-05-30 ENCOUNTER — Emergency Department (HOSPITAL_BASED_OUTPATIENT_CLINIC_OR_DEPARTMENT_OTHER)
Admission: EM | Admit: 2022-05-30 | Discharge: 2022-05-30 | Disposition: A | Discharge status: Home / Self Care | Payer: BLUE CROSS/BLUE SHIELD | Attending: Emergency Medicine | Admitting: Emergency Medicine

## 2022-05-30 ENCOUNTER — Encounter (HOSPITAL_BASED_OUTPATIENT_CLINIC_OR_DEPARTMENT_OTHER): Payer: Self-pay

## 2022-05-30 DIAGNOSIS — R197 Diarrhea, unspecified: Secondary | ICD-10-CM | POA: Diagnosis present

## 2022-05-30 DIAGNOSIS — K529 Noninfective gastroenteritis and colitis, unspecified: Secondary | ICD-10-CM | POA: Diagnosis not present

## 2022-05-30 DIAGNOSIS — Z8616 Personal history of COVID-19: Secondary | ICD-10-CM | POA: Diagnosis not present

## 2022-05-30 DIAGNOSIS — B9689 Other specified bacterial agents as the cause of diseases classified elsewhere: Secondary | ICD-10-CM | POA: Diagnosis not present

## 2022-05-30 DIAGNOSIS — A599 Trichomoniasis, unspecified: Secondary | ICD-10-CM | POA: Insufficient documentation

## 2022-05-30 DIAGNOSIS — N760 Acute vaginitis: Secondary | ICD-10-CM | POA: Diagnosis not present

## 2022-05-30 DIAGNOSIS — Z20822 Contact with and (suspected) exposure to covid-19: Secondary | ICD-10-CM | POA: Diagnosis not present

## 2022-05-30 LAB — URINALYSIS, ROUTINE W REFLEX MICROSCOPIC
Bilirubin Urine: NEGATIVE
Glucose, UA: NEGATIVE mg/dL
Hgb urine dipstick: NEGATIVE
Ketones, ur: NEGATIVE mg/dL
Nitrite: NEGATIVE
Specific Gravity, Urine: 1.027 (ref 1.005–1.030)
pH: 8.5 — ABNORMAL HIGH (ref 5.0–8.0)

## 2022-05-30 LAB — COMPREHENSIVE METABOLIC PANEL
ALT: 12 U/L (ref 0–44)
AST: 14 U/L — ABNORMAL LOW (ref 15–41)
Albumin: 4.3 g/dL (ref 3.5–5.0)
Alkaline Phosphatase: 48 U/L (ref 38–126)
Anion gap: 10 (ref 5–15)
BUN: 13 mg/dL (ref 6–20)
CO2: 24 mmol/L (ref 22–32)
Calcium: 9.5 mg/dL (ref 8.9–10.3)
Chloride: 105 mmol/L (ref 98–111)
Creatinine, Ser: 0.87 mg/dL (ref 0.44–1.00)
GFR, Estimated: >60 mL/min (ref >60)
Glucose, Bld: 131 mg/dL — ABNORMAL HIGH (ref 70–99)
Potassium: 3.6 mmol/L (ref 3.5–5.1)
Sodium: 139 mmol/L (ref 135–145)
Total Bilirubin: 0.5 mg/dL (ref 0.3–1.2)
Total Protein: 7.7 g/dL (ref 6.5–8.1)

## 2022-05-30 LAB — PREGNANCY, URINE: Preg Test, Ur: NEGATIVE

## 2022-05-30 LAB — TROPONIN I (HIGH SENSITIVITY)
Troponin I (High Sensitivity): <2 ng/L (ref <18)
Troponin I (High Sensitivity): <2 ng/L (ref <18)

## 2022-05-30 LAB — CBC
HCT: 40.4 % (ref 36.0–46.0)
Hemoglobin: 13.4 g/dL (ref 12.0–15.0)
MCH: 27.2 pg (ref 26.0–34.0)
MCHC: 33.2 g/dL (ref 30.0–36.0)
MCV: 81.9 fL (ref 80.0–100.0)
Platelets: 356 10*3/uL (ref 150–400)
RBC: 4.93 MIL/uL (ref 3.87–5.11)
RDW: 14.4 % (ref 11.5–15.5)
WBC: 9.3 10*3/uL (ref 4.0–10.5)
nRBC: 0 % (ref 0.0–0.2)

## 2022-05-30 LAB — LIPASE, BLOOD: Lipase: <10 U/L — ABNORMAL LOW (ref 11–51)

## 2022-05-30 LAB — WET PREP, GENITAL
Sperm: NONE SEEN
WBC, Wet Prep HPF POC: >=10 — AB (ref <10)
Yeast Wet Prep HPF POC: NONE SEEN

## 2022-05-30 LAB — RESP PANEL BY RT-PCR (RSV, FLU A&B, COVID)  RVPGX2
Influenza A by PCR: NEGATIVE
Influenza B by PCR: NEGATIVE
Resp Syncytial Virus by PCR: NEGATIVE
SARS Coronavirus 2 by RT PCR: NEGATIVE

## 2022-05-30 MED ORDER — ONDANSETRON 4 MG PO TBDP: 4.0000 mg | ORAL_TABLET | Freq: Three times a day (TID) | ORAL | 0 refills | Status: AC | PRN

## 2022-05-30 MED ORDER — DICYCLOMINE HCL 20 MG PO TABS: 20.0000 mg | ORAL_TABLET | Freq: Two times a day (BID) | ORAL | 0 refills | Status: AC | PRN

## 2022-05-30 MED ORDER — FLUCONAZOLE 200 MG PO TABS: 200.0000 mg | ORAL_TABLET | Freq: Every day | ORAL | 0 refills | Status: AC

## 2022-05-30 MED ORDER — METOCLOPRAMIDE HCL 5 MG/ML IJ SOLN
10.0000 mg | Freq: Once | INTRAMUSCULAR | Status: AC
  Administered 2022-05-30: 10 mg via INTRAVENOUS
  Filled 2022-05-30: qty 2

## 2022-05-30 MED ORDER — DICYCLOMINE HCL 10 MG/ML IM SOLN
20.0000 mg | Freq: Once | INTRAMUSCULAR | Status: DC
  Filled 2022-05-30: qty 2

## 2022-05-30 MED ORDER — DOXYCYCLINE HYCLATE 100 MG PO CAPS: 100.0000 mg | ORAL_CAPSULE | Freq: Two times a day (BID) | ORAL | 0 refills | Status: AC

## 2022-05-30 MED ORDER — METRONIDAZOLE 500 MG PO TABS: 500.0000 mg | ORAL_TABLET | Freq: Two times a day (BID) | ORAL | 0 refills | Status: AC

## 2022-05-30 MED ORDER — ACETAMINOPHEN 325 MG PO TABS
650.0000 mg | ORAL_TABLET | Freq: Once | ORAL | Status: AC
  Administered 2022-05-30: 650 mg via ORAL
  Filled 2022-05-30: qty 2

## 2022-05-30 MED ORDER — LACTATED RINGERS IV BOLUS
1000.0000 mL | Freq: Once | INTRAVENOUS | Status: AC
  Administered 2022-05-30: 1000 mL via INTRAVENOUS

## 2022-05-30 MED ORDER — CEFTRIAXONE SODIUM 500 MG IJ SOLR
500.0000 mg | Freq: Once | INTRAMUSCULAR | Status: AC
  Administered 2022-05-30: 500 mg via INTRAMUSCULAR
  Filled 2022-05-30: qty 500

## 2022-05-30 MED ORDER — ONDANSETRON HCL 4 MG/2ML IJ SOLN
4.0000 mg | Freq: Once | INTRAMUSCULAR | Status: AC | PRN
  Administered 2022-05-30: 4 mg via INTRAVENOUS
  Filled 2022-05-30: qty 2

## 2022-05-30 NOTE — ED Provider Notes (Signed)
[facility] EMERGENCY DEPT Provider Note   CSN: [id number] Arrival date & time: 05/30/22  0801     History  Chief Complaint  Patient presents with   Abdominal Pain    NVD since yesterday, lower abd pain    Rachel Russo is a 31 y.o. female.  HPI     Yesterday nausea In middle of night up every 1.5 to 2 hours throwing up and having diarrhea, thinks went at least 6 times prior to calling ambulance.   Abd pain all over, cramping prior to diarrhea No dysuria Menses was shorter, feeling cravings, nausea, has not taken pregnancy test. LMP was 12/3 but short.  11/6 was normal .  Vaginal discharge for one week.   No fever at home. cough, congestion No known sick contacts, no recent abx   Past Medical History:  Diagnosis Date   Pneumonia due to COVID-19 virus    05/15/2020 dx      Home Medications Prior to Admission medications   Medication Sig Start Date End Date Taking? Authorizing Provider  dicyclomine (BENTYL) 20 MG tablet Take 1 tablet (20 mg total) by mouth 2 (two) times daily as needed for spasms (abdominal pain). 05/30/22  Yes Alvira Monday, MD  doxycycline (VIBRAMYCIN) 100 MG capsule Take 1 capsule (100 mg total) by mouth 2 (two) times daily for 7 days. 05/30/22 06/06/22 Yes Alvira Monday, MD  fluconazole (DIFLUCAN) 200 MG tablet Take 1 tablet (200 mg total) by mouth daily for 1 dose. 05/30/22 05/31/22 Yes Alvira Monday, MD  metroNIDAZOLE (FLAGYL) 500 MG tablet Take 1 tablet (500 mg total) by mouth 2 (two) times daily for 7 days. 05/30/22 06/06/22 Yes Alvira Monday, MD  ondansetron (ZOFRAN-ODT) 4 MG disintegrating tablet Take 1 tablet (4 mg total) by mouth every 8 (eight) hours as needed for nausea or vomiting. 05/30/22  Yes Alvira Monday, MD  benzonatate (TESSALON) 100 MG capsule Take 1 capsule (100 mg total) by mouth every 8 (eight) hours. 05/27/20   Hall-Potvin, [place], PA-C  naproxen (NAPROSYN) 500 MG tablet Take 1 tablet (500 mg  total) by mouth 2 (two) times daily. 05/27/20   Hall-Potvin, [place], PA-C  cetirizine (ZYRTEC) 10 MG tablet Take 1 tablet (10 mg total) by mouth daily. 09/05/18 05/27/20  Cathie Hoops, Amy V, PA-C  fluticasone (FLONASE) 50 MCG/ACT nasal spray Place 2 sprays into both nostrils daily. 09/05/18 05/27/20  Belinda Fisher, PA-C      Allergies    Patient has no known allergies.    Review of Systems   Review of Systems  Physical Exam Updated Vital Signs BP 123/65   Pulse (!) 103   Temp (!) 102 F (38.9 C) (Oral)   Resp 20   LMP 04/10/2022   SpO2 97%  Physical Exam Vitals and nursing note reviewed.  Constitutional:      General: She is not in acute distress.    Appearance: She is well-developed. She is not diaphoretic.  HENT:     Head: Normocephalic and atraumatic.  Eyes:     Conjunctiva/sclera: Conjunctivae normal.  Cardiovascular:     Rate and Rhythm: Normal rate and regular rhythm.     Heart sounds: Normal heart sounds. No murmur heard.    No friction rub. No gallop.  Pulmonary:     Effort: Pulmonary effort is normal. No respiratory distress.     Breath sounds: Normal breath sounds. No wheezing or rales.  Abdominal:     General: There is no distension.     Palpations:  Abdomen is soft.     Tenderness: There is abdominal tenderness (mild, diffuse). There is no guarding.  Genitourinary:    Cervix: Discharge present. No cervical motion tenderness.     Uterus: Not tender.      Adnexa:        Right: No tenderness.         Left: No tenderness.    Musculoskeletal:        General: No tenderness.     Cervical back: Normal range of motion.  Skin:    General: Skin is warm and dry.     Findings: No erythema or rash.  Neurological:     Mental Status: She is alert and oriented to person, place, and time.     ED Results / Procedures / Treatments   Labs (all labs ordered are listed, but only abnormal results are displayed) Labs Reviewed  WET PREP, GENITAL - Abnormal; Notable for the following  components:      Result Value   Trich, Wet Prep PRESENT (*)    Clue Cells Wet Prep HPF POC PRESENT (*)    WBC, Wet Prep HPF POC >=10 (*)    All other components within normal limits  URINALYSIS, ROUTINE W REFLEX MICROSCOPIC - Abnormal; Notable for the following components:   pH 8.5 (*)    Protein, ur TRACE (*)    Leukocytes,Ua SMALL (*)    Bacteria, UA RARE (*)    All other components within normal limits  LIPASE, BLOOD - Abnormal; Notable for the following components:   Lipase <10 (*)    All other components within normal limits  COMPREHENSIVE METABOLIC PANEL - Abnormal; Notable for the following components:   Glucose, Bld 131 (*)    AST 14 (*)    All other components within normal limits  RESP PANEL BY RT-PCR (RSV, FLU A&B, COVID)  RVPGX2  PREGNANCY, URINE  CBC  GC/CHLAMYDIA PROBE AMP ([place]) NOT AT [facility]  TROPONIN I (HIGH SENSITIVITY)  TROPONIN I (HIGH SENSITIVITY)    EKG EKG Interpretation  Date/Time:  Tuesday May 30 2022 08:22:18 EST Ventricular Rate:  95 PR Interval:  156 QRS Duration: 105 QT Interval:  345 QTC Calculation: 434 R Axis:   33 Text Interpretation: Sinus rhythm No significant change since last tracing Confirmed by Alvira Monday ([id number]) on 05/30/2022 8:43:07 AM  Radiology No results found.  Procedures Procedures    Medications Ordered in ED Medications  ondansetron (ZOFRAN) injection 4 mg (4 mg Intravenous Given 05/30/22 0913)  lactated ringers bolus 1,000 mL (0 mLs Intravenous Stopped 05/30/22 1056)  metoCLOPramide (REGLAN) injection 10 mg (10 mg Intravenous Given 05/30/22 1059)  lactated ringers bolus 1,000 mL (1,000 mLs Intravenous New Bag/Given 05/30/22 1244)  acetaminophen (TYLENOL) tablet 650 mg (650 mg Oral Given 05/30/22 1245)  cefTRIAXone (ROCEPHIN) injection 500 mg (500 mg Intramuscular Given 05/30/22 1352)    ED Course/ Medical Decision Making/ A&P   31 year old female with no significant medical history presents with  concern for nausea, vomiting and diarrhea.  No focal pain on abdominal exam, and low suspicion for appendicitis, diverticulitis, cholecystitis.  Labs are completed and personally read interpreted by me show no evidence of pancreatitis, hepatitis, significant electrolyte abnormalities or renal dysfunction.  She developed fever after being in the emergency department, noted to have history of nausea, vomiting and fever likely consistent with a viral gastroenteritis.  Did she send COVID, flu and RSV testing which were negative.  She was given IV fluids, Zofran and  Reglan with improvement of her nausea, unable to tolerate p.o.  Do not see indication for admission to the hospital at this time.  She also reports vaginal discharge for 1 week.  Wet prep was positive for trichomonas and clue cells, given IM dose of Rocephin, prescription for Flagyl for BV and trichomonas and a prescription for doxycycline for gonorrhea/chlamydia.  Her exam is not consistent with PID and have low suspicion for TOA.  In addition to the antibiotics for trichomonas, BV, diarrhea/chlamydia, given prescription for Zofran for nausea, and Bentyl in the setting of abdominal cramping secondary to suspected gastroenteritis.  Discussed return precautions. Patient discharged in stable condition with understanding of reasons to return.         Final Clinical Impression(s) / ED Diagnoses Final diagnoses:  Gastroenteritis  Trichomonas infection  Bacterial vaginosis    Rx / DC Orders ED Discharge Orders          Ordered    ondansetron (ZOFRAN-ODT) 4 MG disintegrating tablet  Every 8 hours PRN        05/30/22 1334    metroNIDAZOLE (FLAGYL) 500 MG tablet  2 times daily        05/30/22 1334    doxycycline (VIBRAMYCIN) 100 MG capsule  2 times daily        05/30/22 1334    dicyclomine (BENTYL) 20 MG tablet  2 times daily PRN        05/30/22 1334    fluconazole (DIFLUCAN) 200 MG tablet  Daily        05/30/22 1338               Alvira Monday, MD 05/30/22 2346

## 2022-05-30 NOTE — ED Triage Notes (Signed)
NVD since yesterday, lower abd pain

## 2022-05-31 LAB — GC/CHLAMYDIA PROBE AMP (~~LOC~~) NOT AT ARMC
Chlamydia: NEGATIVE
Comment: NEGATIVE
Comment: NORMAL
Neisseria Gonorrhea: NEGATIVE

## 2023-08-20 ENCOUNTER — Encounter (HOSPITAL_BASED_OUTPATIENT_CLINIC_OR_DEPARTMENT_OTHER): Payer: Self-pay | Admitting: Emergency Medicine

## 2023-08-20 DIAGNOSIS — Z8616 Personal history of COVID-19: Secondary | ICD-10-CM | POA: Insufficient documentation

## 2023-08-20 DIAGNOSIS — R079 Chest pain, unspecified: Secondary | ICD-10-CM | POA: Insufficient documentation

## 2023-08-20 DIAGNOSIS — R112 Nausea with vomiting, unspecified: Secondary | ICD-10-CM | POA: Diagnosis not present

## 2023-08-20 DIAGNOSIS — R1011 Right upper quadrant pain: Secondary | ICD-10-CM | POA: Diagnosis not present

## 2023-08-20 DIAGNOSIS — R197 Diarrhea, unspecified: Secondary | ICD-10-CM | POA: Insufficient documentation

## 2023-08-20 LAB — RESP PANEL BY RT-PCR (RSV, FLU A&B, COVID)  RVPGX2
Influenza A by PCR: NEGATIVE
Influenza B by PCR: NEGATIVE
Resp Syncytial Virus by PCR: NEGATIVE
SARS Coronavirus 2 by RT PCR: NEGATIVE

## 2023-08-20 LAB — CBC WITH DIFFERENTIAL/PLATELET
Abs Immature Granulocytes: 0.01 10*3/uL (ref 0.00–0.07)
Basophils Absolute: 0 10*3/uL (ref 0.0–0.1)
Basophils Relative: 0 %
Eosinophils Absolute: 0 10*3/uL (ref 0.0–0.5)
Eosinophils Relative: 0 %
HCT: 38.7 % (ref 36.0–46.0)
Hemoglobin: 12.9 g/dL (ref 12.0–15.0)
Immature Granulocytes: 0 %
Lymphocytes Relative: 16 %
Lymphs Abs: 1.1 10*3/uL (ref 0.7–4.0)
MCH: 27.6 pg (ref 26.0–34.0)
MCHC: 33.3 g/dL (ref 30.0–36.0)
MCV: 82.9 fL (ref 80.0–100.0)
Monocytes Absolute: 0.4 10*3/uL (ref 0.1–1.0)
Monocytes Relative: 5 %
Neutro Abs: 5.7 10*3/uL (ref 1.7–7.7)
Neutrophils Relative %: 79 %
Platelets: 329 10*3/uL (ref 150–400)
RBC: 4.67 MIL/uL (ref 3.87–5.11)
RDW: 13.6 % (ref 11.5–15.5)
WBC: 7.2 10*3/uL (ref 4.0–10.5)
nRBC: 0 % (ref 0.0–0.2)

## 2023-08-20 LAB — BASIC METABOLIC PANEL
Anion gap: 8 (ref 5–15)
BUN: 11 mg/dL (ref 6–20)
CO2: 24 mmol/L (ref 22–32)
Calcium: 9.5 mg/dL (ref 8.9–10.3)
Chloride: 105 mmol/L (ref 98–111)
Creatinine, Ser: 0.97 mg/dL (ref 0.44–1.00)
GFR, Estimated: >60 mL/min (ref >60)
Glucose, Bld: 107 mg/dL — ABNORMAL HIGH (ref 70–99)
Potassium: 3.6 mmol/L (ref 3.5–5.1)
Sodium: 137 mmol/L (ref 135–145)

## 2023-08-20 NOTE — ED Triage Notes (Signed)
 Body ache, chest pains under right breast [place]a Started this am

## 2023-08-21 ENCOUNTER — Emergency Department (HOSPITAL_BASED_OUTPATIENT_CLINIC_OR_DEPARTMENT_OTHER)

## 2023-08-21 ENCOUNTER — Emergency Department (HOSPITAL_BASED_OUTPATIENT_CLINIC_OR_DEPARTMENT_OTHER)
Admission: EM | Admit: 2023-08-21 | Discharge: 2023-08-21 | Disposition: A | Discharge status: Home / Self Care | Attending: Emergency Medicine | Admitting: Emergency Medicine

## 2023-08-21 ENCOUNTER — Encounter (HOSPITAL_BASED_OUTPATIENT_CLINIC_OR_DEPARTMENT_OTHER): Payer: Self-pay

## 2023-08-21 DIAGNOSIS — R112 Nausea with vomiting, unspecified: Secondary | ICD-10-CM

## 2023-08-21 DIAGNOSIS — R1011 Right upper quadrant pain: Secondary | ICD-10-CM

## 2023-08-21 HISTORY — DX: Type 2 diabetes mellitus without complications: E11.9

## 2023-08-21 LAB — D-DIMER, QUANTITATIVE: D-Dimer, Quant: 0.28 ug{FEU}/mL (ref 0.00–0.50)

## 2023-08-21 LAB — HEPATIC FUNCTION PANEL
ALT: 13 U/L (ref 0–44)
AST: 12 U/L — ABNORMAL LOW (ref 15–41)
Albumin: 4 g/dL (ref 3.5–5.0)
Alkaline Phosphatase: 39 U/L (ref 38–126)
Bilirubin, Direct: 0.1 mg/dL (ref 0.0–0.2)
Indirect Bilirubin: 0.5 mg/dL (ref 0.3–0.9)
Total Bilirubin: 0.6 mg/dL (ref 0.0–1.2)
Total Protein: 7 g/dL (ref 6.5–8.1)

## 2023-08-21 LAB — TROPONIN I (HIGH SENSITIVITY): Troponin I (High Sensitivity): <2 ng/L (ref <18)

## 2023-08-21 LAB — LIPASE, BLOOD: Lipase: <10 U/L — ABNORMAL LOW (ref 11–51)

## 2023-08-21 LAB — PREGNANCY, URINE: Preg Test, Ur: NEGATIVE

## 2023-08-21 MED ORDER — ONDANSETRON HCL 4 MG/2ML IJ SOLN
4.0000 mg | Freq: Once | INTRAMUSCULAR | Status: AC
  Administered 2023-08-21: 4 mg via INTRAVENOUS
  Filled 2023-08-21: qty 2

## 2023-08-21 MED ORDER — PROMETHAZINE HCL 25 MG PO TABS: 25.0000 mg | ORAL_TABLET | Freq: Four times a day (QID) | ORAL | 0 refills | Status: AC | PRN

## 2023-08-21 MED ORDER — LACTATED RINGERS IV BOLUS
1000.0000 mL | Freq: Once | INTRAVENOUS | Status: AC
  Administered 2023-08-21: 1000 mL via INTRAVENOUS

## 2023-08-21 MED ORDER — HYDROMORPHONE HCL 1 MG/ML IJ SOLN
1.0000 mg | Freq: Once | INTRAMUSCULAR | Status: AC
  Administered 2023-08-21: 1 mg via INTRAVENOUS
  Filled 2023-08-21: qty 1

## 2023-08-21 MED ORDER — PROMETHAZINE HCL 25 MG RE SUPP: 25.0000 mg | Freq: Four times a day (QID) | RECTAL | 0 refills | Status: AC | PRN

## 2023-08-21 MED ORDER — PROCHLORPERAZINE EDISYLATE 10 MG/2ML IJ SOLN
10.0000 mg | Freq: Once | INTRAMUSCULAR | Status: AC
  Administered 2023-08-21: 10 mg via INTRAVENOUS
  Filled 2023-08-21: qty 2

## 2023-08-21 NOTE — ED Notes (Signed)
 Patient transported to [place]

## 2023-08-21 NOTE — ED Notes (Signed)
Po gingerale given. 

## 2023-08-21 NOTE — ED Provider Notes (Signed)
 [place] EMERGENCY DEPARTMENT AT [facility] Provider Note   CSN: [id number] Arrival date & time: 08/20/23  2040     History  Chief Complaint  Patient presents with   Chest Pain    Rachel Russo is a 33 y.o. female.  History of diabetes on metformin and well-controlled with that presents the ER today with a few different symptoms.  Patient's main issue is that she been having diarrhea, nausea, epigastric and right upper quadrant pain throughout the day today.  States her mother has a history of gallbladder disease so is what worries her as.  Has not really tried to eat much and has not thrown up with felt severely nauseous.  She also states that she has a cold feeling in both of her feet.  Sounds like paresthesias.  She also states that she has a headache but she thinks that is from being dehydrated just feeling bad.  No fevers.  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   Chest Pain      Home Medications Prior to Admission medications   Medication Sig Start Date End Date Taking? Authorizing Provider  metFORMIN (GLUCOPHAGE-XR) 500 MG 24 hr tablet Take 1 tablet by mouth daily with breakfast. 06/14/23  Yes [provider]  promethazine (PHENERGAN) 25 MG suppository Place 1 suppository (25 mg total) rectally every 6 (six) hours as needed for nausea or vomiting. 08/21/23  Yes Idy Rawling, Barbara Cower, MD  promethazine (PHENERGAN) 25 MG tablet Take 1 tablet (25 mg total) by mouth every 6 (six) hours as needed for nausea or vomiting. 08/21/23  Yes Lucely Leard, Barbara Cower, MD  benzonatate (TESSALON) 100 MG capsule Take 1 capsule (100 mg total) by mouth every 8 (eight) hours. 05/27/20   Hall-Potvin, [place], PA-C  dicyclomine (BENTYL) 20 MG tablet Take 1 tablet (20 mg total) by mouth 2 (two) times daily as needed for spasms (abdominal pain). 05/30/22   Alvira Monday, MD  naproxen (NAPROSYN) 500 MG tablet Take 1 tablet (500 mg total) by mouth 2 (two) times  daily. 05/27/20   Hall-Potvin, [place], PA-C  ondansetron (ZOFRAN-ODT) 4 MG disintegrating tablet Take 1 tablet (4 mg total) by mouth every 8 (eight) hours as needed for nausea or vomiting. 05/30/22   Alvira Monday, MD  cetirizine (ZYRTEC) 10 MG tablet Take 1 tablet (10 mg total) by mouth daily. 09/05/18 05/27/20  Cathie Hoops, Amy V, PA-C  fluticasone (FLONASE) 50 MCG/ACT nasal spray Place 2 sprays into both nostrils daily. 09/05/18 05/27/20  Belinda Fisher, PA-C      Allergies    Patient has no known allergies.    Review of Systems   Review of Systems  Cardiovascular:  Positive for chest pain.    Physical Exam Updated Vital Signs BP 121/66   Pulse (!) 102   Temp 98 F (36.7 C)   Resp (!) 21   LMP 07/31/2023   SpO2 97%  Physical Exam Vitals and nursing note reviewed.  Constitutional:      Appearance: She is well-developed.  HENT:     Head: Normocephalic and atraumatic.  Cardiovascular:     Rate and Rhythm: Regular rhythm. Tachycardia present.  Pulmonary:     Effort: No respiratory distress.     Breath sounds: No stridor.  Abdominal:     General: There is no distension.     Tenderness: There is abdominal tenderness (Mild epigastric but no rebound or guarding.  Distractible.).  Musculoskeletal:     Cervical back: Normal  range of motion.  Neurological:     Mental Status: She is alert.     ED Results / Procedures / Treatments   Labs (all labs ordered are listed, but only abnormal results are displayed) Labs Reviewed  BASIC METABOLIC PANEL - Abnormal; Notable for the following components:      Result Value   Glucose, Bld 107 (*)    All other components within normal limits  HEPATIC FUNCTION PANEL - Abnormal; Notable for the following components:   AST 12 (*)    All other components within normal limits  LIPASE, BLOOD - Abnormal; Notable for the following components:   Lipase <10 (*)    All other components within normal limits  RESP PANEL BY RT-PCR (RSV, FLU A&B, COVID)  RVPGX2   CBC WITH DIFFERENTIAL/PLATELET  D-DIMER, QUANTITATIVE  PREGNANCY, URINE  TROPONIN I (HIGH SENSITIVITY)    EKG None  Radiology US Abdomen Limited RUQ (LIVER/GB) Result Date: 08/21/2023 CLINICAL DATA:  33 year old female with abdominal pain. EXAM: ULTRASOUND ABDOMEN LIMITED RIGHT UPPER QUADRANT COMPARISON:  Abdomen ultrasound 11/01/2015. FINDINGS: Gallbladder: No gallstones or wall thickening visualized. No sonographic Murphy sign noted by sonographer. Common bile duct: Diameter: 4 mm, normal. Liver: Echogenic liver (image 37). No discrete liver lesion. No evidence of intrahepatic biliary ductal dilatation. Portal vein is patent on color Doppler imaging with normal direction of blood flow towards the liver. Other: Negative visible right kidney.  No free fluid. IMPRESSION: 1. Chronic Fatty liver disease. 2. Otherwise negative right upper quadrant ultrasound. Electronically Signed   By: Odessa Fleming M.D.   On: 08/21/2023 05:33    Procedures Procedures    Medications Ordered in ED Medications  HYDROmorphone (DILAUDID) injection 1 mg (1 mg Intravenous Given 08/21/23 0133)  ondansetron (ZOFRAN) injection 4 mg (4 mg Intravenous Given 08/21/23 0133)  lactated ringers bolus 1,000 mL (0 mLs Intravenous Stopped 08/21/23 0230)  prochlorperazine (COMPAZINE) injection 10 mg (10 mg Intravenous Given 08/21/23 7253)  lactated ringers bolus 1,000 mL (0 mLs Intravenous Stopped 08/21/23 0710)  lactated ringers bolus 1,000 mL (0 mLs Intravenous Stopped 08/21/23 0835)    ED Course/ Medical Decision Making/ A&P                                 Medical Decision Making Amount and/or Complexity of Data Reviewed Labs: ordered. Radiology: ordered. ECG/medicine tests: ordered.  Risk Prescription drug management.  Patient with likely gastroenteritis however with focal pain in the epigastric right upper quadrant area have to consider ulcer versus pancreatitis versus cholecystitis.  Will treat symptoms and wait for  labs and add on ultrasound to see if we get it. [place] negative for any acute issues. Was feeling well until she got up to go to [place] and had some emesis again. Still feels dehydrated. Will give reglan/fluids and reassess.  Improved symptoms. Still slightly tachy. Will let her finish fluids, but tolerating PO well. Stable for d/c.    Final Clinical Impression(s) / ED Diagnoses Final diagnoses:  Right upper quadrant abdominal pain  Nausea and vomiting, unspecified vomiting type    Rx / DC Orders ED Discharge Orders          Ordered    promethazine (PHENERGAN) 25 MG tablet  Every 6 hours PRN        08/21/23 0716    promethazine (PHENERGAN) 25 MG suppository  Every 6 hours PRN        08/21/23  2130              Marily Memos, MD 08/22/23 [phone]
